# Patient Record
Sex: Female | Born: 1995 | Race: White | Hispanic: No | Marital: Married | Smoking: Never smoker
Health system: Southern US, Community
[De-identification: ages and names within clinical notes are randomized; demographics above are authoritative.]

## PROBLEM LIST (undated history)

## (undated) ENCOUNTER — Ambulatory Visit: Discharge status: Absent without leave | Payer: BC Managed Care – PPO

## (undated) ENCOUNTER — Inpatient Hospital Stay (HOSPITAL_COMMUNITY): Payer: Self-pay

## (undated) DIAGNOSIS — J302 Other seasonal allergic rhinitis: Secondary | ICD-10-CM

## (undated) DIAGNOSIS — D649 Anemia, unspecified: Secondary | ICD-10-CM

## (undated) DIAGNOSIS — R000 Tachycardia, unspecified: Secondary | ICD-10-CM

## (undated) DIAGNOSIS — Z803 Family history of malignant neoplasm of breast: Secondary | ICD-10-CM

## (undated) DIAGNOSIS — Z800 Family history of malignant neoplasm of digestive organs: Secondary | ICD-10-CM

## (undated) HISTORY — DX: Family history of malignant neoplasm of breast: Z80.3

## (undated) HISTORY — DX: Tachycardia, unspecified: R00.0

## (undated) HISTORY — DX: Other seasonal allergic rhinitis: J30.2

## (undated) HISTORY — PX: MULTIPLE TOOTH EXTRACTIONS: SHX2053

## (undated) HISTORY — DX: Family history of malignant neoplasm of digestive organs: Z80.0

## (undated) HISTORY — PX: NASAL FRACTURE SURGERY: SHX718

---
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
## 2016-05-21 DIAGNOSIS — R5383 Other fatigue: Secondary | ICD-10-CM | POA: Diagnosis not present

## 2016-05-23 DIAGNOSIS — R5383 Other fatigue: Secondary | ICD-10-CM | POA: Diagnosis not present

## 2016-07-13 DIAGNOSIS — L2489 Irritant contact dermatitis due to other agents: Secondary | ICD-10-CM | POA: Diagnosis not present

## 2016-09-25 ENCOUNTER — Other Ambulatory Visit: Payer: Self-pay | Admitting: Adult Health

## 2016-10-01 DIAGNOSIS — J00 Acute nasopharyngitis [common cold]: Secondary | ICD-10-CM | POA: Diagnosis not present

## 2016-10-01 DIAGNOSIS — J029 Acute pharyngitis, unspecified: Secondary | ICD-10-CM | POA: Diagnosis not present

## 2016-10-01 DIAGNOSIS — Z20828 Contact with and (suspected) exposure to other viral communicable diseases: Secondary | ICD-10-CM | POA: Diagnosis not present

## 2016-10-17 ENCOUNTER — Encounter: Payer: Self-pay | Admitting: Adult Health

## 2016-10-17 ENCOUNTER — Other Ambulatory Visit (HOSPITAL_COMMUNITY)
Admission: RE | Admit: 2016-10-17 | Discharge: 2016-10-17 | Disposition: A | Discharge status: Home / Self Care | Payer: 59 | Source: Ambulatory Visit | Attending: Adult Health | Admitting: Adult Health

## 2016-10-17 ENCOUNTER — Ambulatory Visit (INDEPENDENT_AMBULATORY_CARE_PROVIDER_SITE_OTHER): Payer: 59 | Admitting: Adult Health

## 2016-10-17 VITALS — BP 128/90 | HR 78 | Resp 18 | Ht 60.0 in | Wt 149.0 lb

## 2016-10-17 DIAGNOSIS — Z01419 Encounter for gynecological examination (general) (routine) without abnormal findings: Secondary | ICD-10-CM

## 2016-10-17 NOTE — Patient Instructions (Signed)
Physical in 1 year Pap in 3 if normal  Take folic acid

## 2016-10-17 NOTE — Addendum Note (Signed)
Addended by: Federico Flake A on: 10/17/2016 11:01 AM   Modules accepted: Orders

## 2016-10-17 NOTE — Progress Notes (Signed)
Patient ID: Sarah Blankenship, female   DOB: 17-Jul-1995, 21 y.o.   MRN: [id number] History of Present Illness: Sarah Blankenship is a 21 year old white female, G0P0 in for well woman gyn exam and first pap. PCP is [place].    Current Medications, Allergies, Past Medical History, Past Surgical History, Family History and Social History were reviewed in [employer] record.     Review of Systems: Patient denies any headaches, hearing loss, fatigue, blurred vision, shortness of breath, chest pain, abdominal pain, problems with bowel movements, urination, or intercourse. No joint pain or mood swings. Uses condoms.     Physical Exam:BP 128/90 (BP Location: Left Arm, Patient Position: Sitting, Cuff Size: Small)   Pulse 78   Resp 18   Ht 5' (1.524 m)   Wt 149 lb (67.6 kg)   LMP 09/20/2016   BMI 29.10 kg/m  General:  Well developed, well nourished, no acute distress Skin:  Warm and dry Neck:  Midline trachea, normal thyroid, good ROM, no lymphadenopathy Lungs; Clear to auscultation bilaterally Breast:  No dominant palpable mass, retraction, or nipple discharge Cardiovascular: Regular rate and rhythm Abdomen:  Soft, non tender, no hepatosplenomegaly Pelvic:  External genitalia is normal in appearance, no lesions.  The vagina is normal in appearance. Urethra has no lesions or masses. The cervix is smooth, pap with GC/CHL and reflex HPV performed.  Uterus is felt to be normal size, shape, and contour.  No adnexal masses or tenderness noted.Bladder is non tender, no masses felt. Extremities/musculoskeletal:  No swelling or varicosities noted, no clubbing or cyanosis Psych:  No mood changes, alert and cooperative,seems happy PHQ 2 score 0.  Impression: 1. Encounter for gynecological examination with Papanicolaou smear of cervix       Plan: Physical in 1 year Pap in 3 if normal  Take folic acid

## 2016-10-18 LAB — CYTOLOGY - PAP
Chlamydia: NEGATIVE
Diagnosis: NEGATIVE
Neisseria Gonorrhea: NEGATIVE

## 2017-05-28 DIAGNOSIS — J029 Acute pharyngitis, unspecified: Secondary | ICD-10-CM | POA: Diagnosis not present

## 2017-09-19 DIAGNOSIS — N390 Urinary tract infection, site not specified: Secondary | ICD-10-CM | POA: Diagnosis not present

## 2017-09-19 DIAGNOSIS — R300 Dysuria: Secondary | ICD-10-CM | POA: Diagnosis not present

## 2017-10-14 ENCOUNTER — Encounter: Payer: Self-pay | Admitting: Obstetrics and Gynecology

## 2017-10-14 ENCOUNTER — Ambulatory Visit: Payer: 59 | Admitting: Obstetrics and Gynecology

## 2017-10-14 ENCOUNTER — Other Ambulatory Visit (INDEPENDENT_AMBULATORY_CARE_PROVIDER_SITE_OTHER): Payer: 59

## 2017-10-14 ENCOUNTER — Other Ambulatory Visit: Payer: Self-pay | Admitting: Obstetrics and Gynecology

## 2017-10-14 VITALS — BP 124/80 | HR 88 | Ht 60.0 in | Wt 152.7 lb

## 2017-10-14 DIAGNOSIS — N8311 Corpus luteum cyst of right ovary: Secondary | ICD-10-CM

## 2017-10-14 DIAGNOSIS — Z3A01 Less than 8 weeks gestation of pregnancy: Secondary | ICD-10-CM

## 2017-10-14 DIAGNOSIS — E663 Overweight: Secondary | ICD-10-CM

## 2017-10-14 DIAGNOSIS — O219 Vomiting of pregnancy, unspecified: Secondary | ICD-10-CM | POA: Diagnosis not present

## 2017-10-14 DIAGNOSIS — N926 Irregular menstruation, unspecified: Secondary | ICD-10-CM

## 2017-10-14 DIAGNOSIS — O3411 Maternal care for benign tumor of corpus uteri, first trimester: Secondary | ICD-10-CM

## 2017-10-14 DIAGNOSIS — Z3201 Encounter for pregnancy test, result positive: Secondary | ICD-10-CM

## 2017-10-14 LAB — POCT URINE PREGNANCY: Preg Test, Ur: POSITIVE — AB

## 2017-10-14 NOTE — Progress Notes (Signed)
  Subjective:     Patient ID: Sarah Blankenship, female   DOB: 04-14-1995, 22 y.o.   MRN: [id number]  HPI Here for pregnancy confirmation reporting LMP 08/28/17, giving EDC 06/04/18 and EGA [redacted]w[redacted]d. Daily nausea and vomiting for the last week.   Review of Systems  Constitutional: Positive for fatigue.  Gastrointestinal: Positive for nausea and vomiting.  All other systems reviewed and are negative.      Objective:   Physical Exam A&Ox4 Well groomed female in no distress Blood pressure 124/80, pulse 88, height 5' (1.524 m), weight 152 lb 11.2 oz (69.3 kg), last menstrual period 08/28/2017. Body mass index is 29.82 kg/m.  UPT+ viability ultrasound reveals: [place] intrauterine pregnancy is visualized with a CRL consistent with 6 5/[redacted] weeks gestation, giving an (U/S) EDD of 06/04/18. The (U/S) EDD is consistent with the clinically established (LMP) EDD of 06/04/18.  FHR: 128 BPM CRL measurement: 8.0 mm Yolk sac and early anatomy is normal.  Right Ovary measures 3.5 x 2.4 x 2.7 cm. It is normal in appearance. Left Ovary measures 2.5 x 2.0 x 1.5 cm. It is normal appearance. There is evidence of a corpus luteal cyst in the right ovary. Survey of the adnexa demonstrates no adnexal masses. There is no free peritoneal fluid in the cul de sac.    Assessment:     Missed menses Overweight Nausea and vomiting     Plan:     New OB labs and nurse intake in 2 weeks then New OB PE with me in 5weeks.   Bonjesta samples given and instructed on use.    Melody Shambley,CNM

## 2017-10-14 NOTE — Patient Instructions (Signed)
First Trimester of Pregnancy The first trimester of pregnancy is from week 1 until the end of week 13 (months 1 through 3). During this time, your baby will begin to develop inside you. At 6-8 weeks, the eyes and face are formed, and the heartbeat can be seen on ultrasound. At the end of 12 weeks, all the baby's organs are formed. Prenatal care is all the medical care you receive before the birth of your baby. Make sure you get good prenatal care and follow all of your doctor's instructions. Follow these instructions at home: Medicines  Take over-the-counter and prescription medicines only as told by your doctor. Some medicines are safe and some medicines are not safe during pregnancy.  Take a prenatal vitamin that contains at least 600 micrograms (mcg) of folic acid.  If you have trouble pooping (constipation), take medicine that will make your stool soft (stool softener) if your doctor approves. Eating and drinking  Eat regular, healthy meals.  Your doctor will tell you the amount of weight gain that is right for you.  Avoid raw meat and uncooked cheese.  If you feel sick to your stomach (nauseous) or throw up (vomit): ? Eat 4 or 5 small meals a day instead of 3 large meals. ? Try eating a few soda crackers. ? Drink liquids between meals instead of during meals.  To prevent constipation: ? Eat foods that are high in fiber, like fresh fruits and vegetables, whole grains, and beans. ? Drink enough fluids to keep your pee (urine) clear or pale yellow. Activity  Exercise only as told by your doctor. Stop exercising if you have cramps or pain in your lower belly (abdomen) or low back.  Do not exercise if it is too hot, too humid, or if you are in a place of great height (high altitude).  Try to avoid standing for long periods of time. Move your legs often if you must stand in one place for a long time.  Avoid heavy lifting.  Wear low-heeled shoes. Sit and stand up straight.  You  can have sex unless your doctor tells you not to. Relieving pain and discomfort  Wear a good support bra if your breasts are sore.  Take warm water baths (sitz baths) to soothe pain or discomfort caused by hemorrhoids. Use hemorrhoid cream if your doctor says it is okay.  Rest with your legs raised if you have leg cramps or low back pain.  If you have puffy, bulging veins (varicose veins) in your legs: ? Wear support hose or compression stockings as told by your doctor. ? Raise (elevate) your feet for 15 minutes, 3-4 times a day. ? Limit salt in your food. Prenatal care  Schedule your prenatal visits by the twelfth week of pregnancy.  Write down your questions. Take them to your prenatal visits.  Keep all your prenatal visits as told by your doctor. This is important. Safety  Wear your seat belt at all times when driving.  Make a list of emergency phone numbers. The list should include numbers for family, friends, the hospital, and police and fire departments. General instructions  Ask your doctor for a referral to a local prenatal class. Begin classes no later than at the start of month 6 of your pregnancy.  Ask for help if you need counseling or if you need help with nutrition. Your doctor can give you advice or tell you where to go for help.  Do not use hot tubs, steam rooms, or   saunas.  Do not douche or use tampons or scented sanitary pads.  Do not cross your legs for long periods of time.  Avoid all herbs and alcohol. Avoid drugs that are not approved by your doctor.  Do not use any tobacco products, including cigarettes, chewing tobacco, and electronic cigarettes. If you need help quitting, ask your doctor. You may get counseling or other support to help you quit.  Avoid cat litter boxes and soil used by cats. These carry germs that can cause birth defects in the baby and can cause a loss of your baby (miscarriage) or stillbirth.  Visit your dentist. At home, brush  your teeth with a soft toothbrush. Be gentle when you floss. Contact a doctor if:  You are dizzy.  You have mild cramps or pressure in your lower belly.  You have a nagging pain in your belly area.  You continue to feel sick to your stomach, you throw up, or you have watery poop (diarrhea).  You have a bad smelling fluid coming from your vagina.  You have pain when you pee (urinate).  You have increased puffiness (swelling) in your face, hands, legs, or ankles. Get help right away if:  You have a fever.  You are leaking fluid from your vagina.  You have spotting or bleeding from your vagina.  You have very bad belly cramping or pain.  You gain or lose weight rapidly.  You throw up blood. It may look like coffee grounds.  You are around people who have [place] measles, fifth disease, or chickenpox.  You have a very bad headache.  You have shortness of breath.  You have any kind of trauma, such as from a fall or a car accident. Summary  The first trimester of pregnancy is from week 1 until the end of week 13 (months 1 through 3).  To take care of yourself and your unborn baby, you will need to eat healthy meals, take medicines only if your doctor tells you to do so, and do activities that are safe for you and your baby.  Keep all follow-up visits as told by your doctor. This is important as your doctor will have to ensure that your baby is healthy and growing well. This information is not intended to replace advice given to you by your health care provider. Make sure you discuss any questions you have with your health care provider. Document Released: 06/12/2007 Document Revised: 01/02/2016 Document Reviewed: 01/02/2016     Morning Sickness Morning sickness is when you feel sick to your stomach (nauseous) during pregnancy. This nauseous feeling may or may not come with vomiting. It often occurs in the morning but can be a problem any time of day. Morning sickness is  most common during the first trimester, but it may continue throughout pregnancy. While morning sickness is unpleasant, it is usually harmless unless you develop severe and continual vomiting (hyperemesis gravidarum). This condition requires more intense treatment. What are the causes? The cause of morning sickness is not completely known but seems to be related to normal hormonal changes that occur in pregnancy. What increases the risk? You are at greater risk if you:  Experienced nausea or vomiting before your pregnancy.  Had morning sickness during a previous pregnancy.  Are pregnant with more than one baby, such as twins.  How is this treated? Do not use any medicines (prescription, over-the-counter, or herbal) for morning sickness without first talking to your health care provider. Your health care provider  may prescribe or recommend:  Vitamin B6 supplements.  Anti-nausea medicines.  The herbal medicine ginger.  Follow these instructions at home:  Only take over-the-counter or prescription medicines as directed by your health care provider.  Taking multivitamins before getting pregnant can prevent or decrease the severity of morning sickness in most women.  Eat a piece of dry toast or unsalted crackers before getting out of bed in the morning.  Eat five or six small meals a day.  Eat dry and bland foods (rice, baked potato). Foods high in carbohydrates are often helpful.  Do not drink liquids with your meals. Drink liquids between meals.  Avoid greasy, fatty, and spicy foods.  Get someone to cook for you if the smell of any food causes nausea and vomiting.  If you feel nauseous after taking prenatal vitamins, take the vitamins at night or with a snack.  Snack on protein foods (nuts, yogurt, cheese) between meals if you are hungry.  Eat unsweetened gelatins for desserts.  Wearing an acupressure wristband (worn for sea sickness) may be helpful.  Acupuncture may be  helpful.  Do not smoke.  Get a humidifier to keep the air in your house free of odors.  Get plenty of fresh air. Contact a health care provider if:  Your home remedies are not working, and you need medicine.  You feel dizzy or lightheaded.  You are losing weight. Get help right away if:  You have persistent and uncontrolled nausea and vomiting.  You pass out (faint). This information is not intended to replace advice given to you by your health care provider. Make sure you discuss any questions you have with your health care provider. Document Released: 02/14/2006 Document Revised: 06/01/2015 Document Reviewed: 06/10/2012 Elsevier Interactive Patient Education  2017 [employer].  Risk analyst Patient Education  [employer].

## 2017-10-24 ENCOUNTER — Ambulatory Visit (INDEPENDENT_AMBULATORY_CARE_PROVIDER_SITE_OTHER): Payer: 59 | Admitting: Obstetrics and Gynecology

## 2017-10-24 VITALS — BP 115/75 | HR 86 | Ht 60.0 in | Wt 153.8 lb

## 2017-10-24 DIAGNOSIS — Z202 Contact with and (suspected) exposure to infections with a predominantly sexual mode of transmission: Secondary | ICD-10-CM

## 2017-10-24 DIAGNOSIS — O219 Vomiting of pregnancy, unspecified: Secondary | ICD-10-CM

## 2017-10-24 DIAGNOSIS — R638 Other symptoms and signs concerning food and fluid intake: Secondary | ICD-10-CM | POA: Diagnosis not present

## 2017-10-24 DIAGNOSIS — Z3401 Encounter for supervision of normal first pregnancy, first trimester: Secondary | ICD-10-CM | POA: Diagnosis not present

## 2017-10-24 NOTE — Patient Instructions (Signed)
WHAT OB PATIENTS CAN EXPECT   Confirmation of pregnancy and ultrasound ordered if medically indicated-[redacted] weeks gestation  New OB (NOB) intake with nurse and New OB (NOB) labs- [redacted] weeks gestation  New OB (NOB) physical examination with provider- 11/[redacted] weeks gestation  Flu vaccine-[redacted] weeks gestation  Anatomy scan-[redacted] weeks gestation  Glucose tolerance test, blood work to test for anemia, T-dap vaccine-[redacted] weeks gestation  Vaginal swabs/cultures-STD/Group B strep-[redacted] weeks gestation  Appointments every 4 weeks until 28 weeks  Every 2 weeks from 28 weeks until 36 weeks  Weekly visits from 36 weeks until delivery  Waterbirth Class  June 19, 2016  Wednesday 7:00p - 9:00p  [place], [place]  July 24, 2016  Wednesday [place], [place]    August 28, 2016   Wednesday [place], [place]  September 25, 2016  Wednesday  [place], [place]  October 23, 2016 Wednesday [place], [place]  Interested in a waterbirth?  This informational class will help you discover whether waterbirth is the right fit for you.  Education about waterbirth itself, supplies you would need and how to assemble your support team is what you can expect from this class.  Some obstetrical practices require this class in order to pursue a waterbirth.  (Not all obstetrical practices offer waterbirth check with your healthcare provider)  Register only the expectant mom, but you are encouraged to bring your partner to class!  Fees & Payment No fee  Register Online [email or web address] Search Waterbirth Morning Sickness Morning sickness is when you feel sick to your stomach (nauseous) during pregnancy. You may feel sick to your stomach and throw up (vomit). You may feel sick in the morning, but you can  feel this way any time of day. Some women feel very sick to their stomach and cannot stop throwing up (hyperemesis gravidarum). Follow these instructions at home:  Only take medicines as told by your doctor.  Take multivitamins as told by your doctor. Taking multivitamins before getting pregnant can stop or lessen the harshness of morning sickness.  Eat dry toast or unsalted crackers before getting out of bed.  Eat 5 to 6 small meals a day.  Eat dry and bland foods like rice and baked potatoes.  Do not drink liquids with meals. Drink between meals.  Do not eat greasy, fatty, or spicy foods.  Have someone cook for you if the smell of food causes you to feel sick or throw up.  If you feel sick to your stomach after taking prenatal vitamins, take them at night or with a snack.  Eat protein when you need a snack (nuts, yogurt, cheese).  Eat unsweetened gelatins for dessert.  Wear a bracelet used for sea sickness (acupressure wristband).  Go to a doctor that puts thin needles into certain body points (acupuncture) to improve how you feel.  Do not smoke.  Use a humidifier to keep the air in your house free of odors.  Get lots of fresh air. Contact a doctor if:  You need medicine to feel better.  You feel dizzy or lightheaded.  You are losing weight. Get help right away if:  You feel very sick to your stomach and cannot stop throwing up.  You pass out (faint). This information is not intended to replace advice given to you by your health care provider. Make sure you  discuss any questions you have with your health care provider. Document Released: 02/01/2004 Document Revised: 06/01/2015 Document Reviewed: 06/10/2012 Elsevier Interactive Patient Education  2017 [employer]. How a Baby Grows During Pregnancy Pregnancy begins when a female's sperm enters a female's egg (fertilization). This happens in one of the tubes (fallopian tubes) that connect the ovaries to the womb  (uterus). The fertilized egg is called an embryo until it reaches 10 weeks. From 10 weeks until birth, it is called a fetus. The fertilized egg moves down the fallopian tube to the uterus. Then it implants into the lining of the uterus and begins to grow. The developing fetus receives oxygen and nutrients through the pregnant woman's bloodstream and the tissues that grow (placenta) to support the fetus. The placenta is the life support system for the fetus. It provides nutrition and removes waste. Learning as much as you can about your pregnancy and how your baby is developing can help you enjoy the experience. It can also make you aware of when there might be a problem and when to ask questions. How long does a typical pregnancy last? A pregnancy usually lasts 280 days, or about 40 weeks. Pregnancy is divided into three trimesters:  First trimester: 0-13 weeks.  Second trimester: 14-27 weeks.  Third trimester: 28-40 weeks.  The day when your baby is considered ready to be born (full term) is your estimated date of delivery. How does my baby develop month by month? First month  The fertilized egg attaches to the inside of the uterus.  Some cells will form the placenta. Others will form the fetus.  The arms, legs, brain, spinal cord, lungs, and heart begin to develop.  At the end of the first month, the heart begins to beat.  Second month  The bones, inner ear, eyelids, hands, and feet form.  The genitals develop.  By the end of 8 weeks, all major organs are developing.  Third month  All of the internal organs are forming.  Teeth develop below the gums.  Bones and muscles begin to grow. The spine can flex.  The skin is transparent.  Fingernails and toenails begin to form.  Arms and legs continue to grow longer, and hands and feet develop.  The fetus is about 3 in (7.6 cm) long.  Fourth month  The placenta is completely formed.  The external sex organs, neck, outer  ear, eyebrows, eyelids, and fingernails are formed.  The fetus can hear, swallow, and move its arms and legs.  The kidneys begin to produce urine.  The skin is covered with a white waxy coating (vernix) and very fine hair (lanugo).  Fifth month  The fetus moves around more and can be felt for the first time (quickening).  The fetus starts to sleep and wake up and may begin to suck its finger.  The nails grow to the end of the fingers.  The organ in the digestive system that makes bile (gallbladder) functions and helps to digest the nutrients.  If your baby is a girl, eggs are present in her ovaries. If your baby is a boy, testicles start to move down into his scrotum.  Sixth month  The lungs are formed, but the fetus is not yet able to breathe.  The eyes open. The brain continues to develop.  Your baby has fingerprints and toe prints. Your baby's hair grows thicker.  At the end of the second trimester, the fetus is about 9 in (22.9 cm) long.  Seventh  month  The fetus kicks and stretches.  The eyes are developed enough to sense changes in light.  The hands can make a grasping motion.  The fetus responds to sound.  Eighth month  All organs and body systems are fully developed and functioning.  Bones harden and taste buds develop. The fetus may hiccup.  Certain areas of the brain are still developing. The skull remains soft.  Ninth month  The fetus gains about  lb (0.23 kg) each week.  The lungs are fully developed.  Patterns of sleep develop.  The fetus's head typically moves into a head-down position (vertex) in the uterus to prepare for birth. If the buttocks move into a vertex position instead, the baby is breech.  The fetus weighs 6-9 lbs (2.72-4.08 kg) and is 19-20 in (48.26-50.8 cm) long.  What can I do to have a healthy pregnancy and help my baby develop? Eating and Drinking  Eat a healthy diet. ? Talk with your health care provider to make sure  that you are getting the nutrients that you and your baby need. ? Visit www.BuildDNA.es to learn about creating a healthy diet.  Gain a healthy amount of weight during pregnancy as advised by your health care provider. This is usually 25-35 pounds. You may need to: ? Gain more if you were underweight before getting pregnant or if you are pregnant with more than one baby. ? Gain less if you were overweight or obese when you got pregnant.  Medicines and Vitamins  Take prenatal vitamins as directed by your health care provider. These include vitamins such as folic acid, iron, calcium, and vitamin D. They are important for healthy development.  Take medicines only as directed by your health care provider. Read labels and ask a pharmacist or your health care provider whether over-the-counter medicines, supplements, and prescription drugs are safe to take during pregnancy.  Activities  Be physically active as advised by your health care provider. Ask your health care provider to recommend activities that are safe for you to do, such as walking or swimming.  Do not participate in strenuous or extreme sports.  Lifestyle  Do not drink alcohol.  Do not use any tobacco products, including cigarettes, chewing tobacco, or electronic cigarettes. If you need help quitting, ask your health care provider.  Do not use illegal drugs.  Safety  Avoid exposure to mercury, lead, or other heavy metals. Ask your health care provider about common sources of these heavy metals.  Avoid listeria infection during pregnancy. Follow these precautions: ? Do not eat soft cheeses or deli meats. ? Do not eat hot dogs unless they have been warmed up to the point of steaming, such as in the microwave oven. ? Do not drink unpasteurized milk.  Avoid toxoplasmosis infection during pregnancy. Follow these precautions: ? Do not change your cat's litter box, if you have a cat. Ask someone else to do this for  you. ? Wear gardening gloves while working in the yard.  General Instructions  Keep all follow-up visits as directed by your health care provider. This is important. This includes prenatal care and screening tests.  Manage any chronic health conditions. Work closely with your health care provider to keep conditions, such as diabetes, under control.  How do I know if my baby is developing well? At each prenatal visit, your health care provider will do several different tests to check on your health and keep track of your baby's development. These include:  Fundal height. ?  Your health care provider will measure your growing belly from top to bottom using a tape measure. ? Your health care provider will also feel your belly to determine your baby's position.  Heartbeat. ? An ultrasound in the first trimester can confirm pregnancy and show a heartbeat, depending on how far along you are. ? Your health care provider will check your baby's heart rate at every prenatal visit. ? As you get closer to your delivery date, you may have regular fetal heart rate monitoring to make sure that your baby is not in distress.  Second trimester ultrasound. ? This ultrasound checks your baby's development. It also indicates your baby's gender.  What should I do if I have concerns about my baby's development? Always talk with your health care provider about any concerns that you may have. This information is not intended to replace advice given to you by your health care provider. Make sure you discuss any questions you have with your health care provider. Document Released: 06/12/2007 Document Revised: 06/01/2015 Document Reviewed: 06/02/2013 Elsevier Interactive Patient Education  2018 [place] of Pregnancy The first trimester of pregnancy is from week 1 until the end of week 13 (months 1 through 3). A week after a sperm fertilizes an egg, the egg will implant on the wall of the  uterus. This embryo will begin to develop into a baby. Genes from you and your partner will form the baby. The female genes will determine whether the baby will be a boy or a girl. At 6-8 weeks, the eyes and face will be formed, and the heartbeat can be seen on ultrasound. At the end of 12 weeks, all the baby's organs will be formed. Now that you are pregnant, you will want to do everything you can to have a healthy baby. Two of the most important things are to get good prenatal care and to follow your health care provider's instructions. Prenatal care is all the medical care you receive before the baby's birth. This care will help prevent, find, and treat any problems during the pregnancy and childbirth. Body changes during your first trimester Your body goes through many changes during pregnancy. The changes vary from woman to woman.  You may gain or lose a couple of pounds at first.  You may feel sick to your stomach (nauseous) and you may throw up (vomit). If the vomiting is uncontrollable, call your health care provider.  You may tire easily.  You may develop headaches that can be relieved by medicines. All medicines should be approved by your health care provider.  You may urinate more often. Painful urination may mean you have a bladder infection.  You may develop heartburn as a result of your pregnancy.  You may develop constipation because certain hormones are causing the muscles that push stool through your intestines to slow down.  You may develop hemorrhoids or swollen veins (varicose veins).  Your breasts may begin to grow larger and become tender. Your nipples may stick out more, and the tissue that surrounds them (areola) may become darker.  Your gums may bleed and may be sensitive to brushing and flossing.  Dark spots or blotches (chloasma, mask of pregnancy) may develop on your face. This will likely fade after the baby is born.  Your menstrual periods will stop.  You may  have a loss of appetite.  You may develop cravings for certain kinds of food.  You may have changes in your emotions from day to  day, such as being excited to be pregnant or being concerned that something may go wrong with the pregnancy and baby.  You may have more vivid and strange dreams.  You may have changes in your hair. These can include thickening of your hair, rapid growth, and changes in texture. Some women also have hair loss during or after pregnancy, or hair that feels dry or thin. Your hair will most likely return to normal after your baby is born.  What to expect at prenatal visits During a routine prenatal visit:  You will be weighed to make sure you and the baby are growing normally.  Your blood pressure will be taken.  Your abdomen will be measured to track your baby's growth.  The fetal heartbeat will be listened to between weeks 10 and 14 of your pregnancy.  Test results from any previous visits will be discussed.  Your health care provider may ask you:  How you are feeling.  If you are feeling the baby move.  If you have had any abnormal symptoms, such as leaking fluid, bleeding, severe headaches, or abdominal cramping.  If you are using any tobacco products, including cigarettes, chewing tobacco, and electronic cigarettes.  If you have any questions.  Other tests that may be performed during your first trimester include:  Blood tests to find your blood type and to check for the presence of any previous infections. The tests will also be used to check for low iron levels (anemia) and protein on red blood cells (Rh antibodies). Depending on your risk factors, or if you previously had diabetes during pregnancy, you may have tests to check for high blood sugar that affects pregnant women (gestational diabetes).  Urine tests to check for infections, diabetes, or protein in the urine.  An ultrasound to confirm the proper growth and development of the  baby.  Fetal screens for spinal cord problems (spina bifida) and Down syndrome.  HIV (human immunodeficiency virus) testing. Routine prenatal testing includes screening for HIV, unless you choose not to have this test.  You may need other tests to make sure you and the baby are doing well.  Follow these instructions at home: Medicines  Follow your health care provider's instructions regarding medicine use. Specific medicines may be either safe or unsafe to take during pregnancy.  Take a prenatal vitamin that contains at least 600 micrograms (mcg) of folic acid.  If you develop constipation, try taking a stool softener if your health care provider approves. Eating and drinking  Eat a balanced diet that includes fresh fruits and vegetables, whole grains, good sources of protein such as meat, eggs, or tofu, and low-fat dairy. Your health care provider will help you determine the amount of weight gain that is right for you.  Avoid raw meat and uncooked cheese. These carry germs that can cause birth defects in the baby.  Eating four or five small meals rather than three large meals a day may help relieve nausea and vomiting. If you start to feel nauseous, eating a few soda crackers can be helpful. Drinking liquids between meals, instead of during meals, also seems to help ease nausea and vomiting.  Limit foods that are high in fat and processed sugars, such as fried and sweet foods.  To prevent constipation: ? Eat foods that are high in fiber, such as fresh fruits and vegetables, whole grains, and beans. ? Drink enough fluid to keep your urine clear or pale yellow. Activity  Exercise only as directed  by your health care provider. Most women can continue their usual exercise routine during pregnancy. Try to exercise for 30 minutes at least 5 days a week. Exercising will help you: ? Control your weight. ? Stay in shape. ? Be prepared for labor and delivery.  Experiencing pain or cramping  in the lower abdomen or lower back is a good sign that you should stop exercising. Check with your health care provider before continuing with normal exercises.  Try to avoid standing for long periods of time. Move your legs often if you must stand in one place for a long time.  Avoid heavy lifting.  Wear low-heeled shoes and practice good posture.  You may continue to have sex unless your health care provider tells you not to. Relieving pain and discomfort  Wear a good support bra to relieve breast tenderness.  Take warm sitz baths to soothe any pain or discomfort caused by hemorrhoids. Use hemorrhoid cream if your health care provider approves.  Rest with your legs elevated if you have leg cramps or low back pain.  If you develop varicose veins in your legs, wear support hose. Elevate your feet for 15 minutes, 3-4 times a day. Limit salt in your diet. Prenatal care  Schedule your prenatal visits by the twelfth week of pregnancy. They are usually scheduled monthly at first, then more often in the last 2 months before delivery.  Write down your questions. Take them to your prenatal visits.  Keep all your prenatal visits as told by your health care provider. This is important. Safety  Wear your seat belt at all times when driving.  Make a list of emergency phone numbers, including numbers for family, friends, the hospital, and police and fire departments. General instructions  Ask your health care provider for a referral to a local prenatal education class. Begin classes no later than the beginning of month 6 of your pregnancy.  Ask for help if you have counseling or nutritional needs during pregnancy. Your health care provider can offer advice or refer you to specialists for help with various needs.  Do not use hot tubs, steam rooms, or saunas.  Do not douche or use tampons or scented sanitary pads.  Do not cross your legs for long periods of time.  Avoid cat litter boxes  and soil used by cats. These carry germs that can cause birth defects in the baby and possibly loss of the fetus by miscarriage or stillbirth.  Avoid all smoking, herbs, alcohol, and medicines not prescribed by your health care provider. Chemicals in these products affect the formation and growth of the baby.  Do not use any products that contain nicotine or tobacco, such as cigarettes and e-cigarettes. If you need help quitting, ask your health care provider. You may receive counseling support and other resources to help you quit.  Schedule a dentist appointment. At home, brush your teeth with a soft toothbrush and be gentle when you floss. Contact a health care provider if:  You have dizziness.  You have mild pelvic cramps, pelvic pressure, or nagging pain in the abdominal area.  You have persistent nausea, vomiting, or diarrhea.  You have a bad smelling vaginal discharge.  You have pain when you urinate.  You notice increased swelling in your face, hands, legs, or ankles.  You are exposed to fifth disease or chickenpox.  You are exposed to [place] measles (rubella) and have never had it. Get help right away if:  You have a fever.  You are leaking fluid from your vagina.  You have spotting or bleeding from your vagina.  You have severe abdominal cramping or pain.  You have rapid weight gain or loss.  You vomit blood or material that looks like coffee grounds.  You develop a severe headache.  You have shortness of breath.  You have any kind of trauma, such as from a fall or a car accident. Summary  The first trimester of pregnancy is from week 1 until the end of week 13 (months 1 through 3).  Your body goes through many changes during pregnancy. The changes vary from woman to woman.  You will have routine prenatal visits. During those visits, your health care provider will examine you, discuss any test results you may have, and talk with you about how you are  feeling. This information is not intended to replace advice given to you by your health care provider. Make sure you discuss any questions you have with your health care provider. Document Released: 12/18/2000 Document Revised: 12/06/2015 Document Reviewed: 12/06/2015 Elsevier Interactive Patient Education  2018 [employer]. Commonly Asked Questions During Pregnancy  Cats: A parasite can be excreted in cat feces.  To avoid exposure you need to have another person empty the little box.  If you must empty the litter box you will need to wear gloves.  Wash your hands after handling your cat.  This parasite can also be found in raw or undercooked meat so this should also be avoided.  Colds, Sore Throats, Flu: Please check your medication sheet to see what you can take for symptoms.  If your symptoms are unrelieved by these medications please call the office.  Dental Work: Most any dental work Investment banker[employer] recommends is permitted.  X-rays should only be taken during the first trimester if absolutely necessary.  Your abdomen should be shielded with a lead apron during all x-rays.  Please notify your provider prior to receiving any x-rays.  Novocaine is fine; gas is not recommended.  If your dentist requires a note from [place] prior to dental work please call the office and we will provide one for you.  Exercise: Exercise is an important part of staying healthy during your pregnancy.  You may continue most exercises you were accustomed to prior to pregnancy.  Later in your pregnancy you will most likely notice you have difficulty with activities requiring balance like riding a bicycle.  It is important that you listen to your body and avoid activities that put you at a higher risk of falling.  Adequate rest and staying well hydrated are a must!  If you have questions about the safety of specific activities ask your provider.    Exposure to Children with illness: Try to avoid obvious exposure; report any  symptoms to [place] when noted,  If you have chicken pos, red measles or mumps, you should be immune to these diseases.   Please do not take any vaccines while pregnant unless you have checked with your OB provider.  Fetal Movement: After 28 weeks we recommend you do "kick counts" twice daily.  Lie or sit down in a calm quiet environment and count your baby movements "kicks".  You should feel your baby at least 10 times per hour.  If you have not felt 10 kicks within the first hour get up, walk around and have something sweet to eat or drink then repeat for an additional hour.  If count remains less than 10 per hour notify your  provider.  Fumigating: Follow your pest control agent's advice as to how long to stay out of your home.  Ventilate the area well before re-entering.  Hemorrhoids:   Most over-the-counter preparations can be used during pregnancy.  Check your medication to see what is safe to use.  It is important to use a stool softener or fiber in your diet and to drink lots of liquids.  If hemorrhoids seem to be getting worse please call the office.   Hot Tubs:  Hot tubs Jacuzzis and saunas are not recommended while pregnant.  These increase your internal body temperature and should be avoided.  Intercourse:  Sexual intercourse is safe during pregnancy as long as you are comfortable, unless otherwise advised by your provider.  Spotting may occur after intercourse; report any bright red bleeding that is heavier than spotting.  Labor:  If you know that you are in labor, please go to the hospital.  If you are unsure, please call the office and let us help you decide what to do.  Lifting, straining, etc:  If your job requires heavy lifting or straining please check with your provider for any limitations.  Generally, you should not lift items heavier than that you can lift simply with your hands and arms (no back muscles)  Painting:  Paint fumes do not harm your pregnancy, but may make you ill and  should be avoided if possible.  Latex or water based paints have less odor than oils.  Use adequate ventilation while painting.  Permanents & Hair Color:  Chemicals in hair dyes are not recommended as they cause increase hair dryness which can increase hair loss during pregnancy.  " Highlighting" and permanents are allowed.  Dye may be absorbed differently and permanents may not hold as well during pregnancy.  Sunbathing:  Use a sunscreen, as skin burns easily during pregnancy.  Drink plenty of fluids; avoid over heating.  Tanning Beds:  Because their possible side effects are still unknown, tanning beds are not recommended.  Ultrasound Scans:  Routine ultrasounds are performed at approximately 20 weeks.  You will be able to see your baby's general anatomy an if you would like to know the gender this can usually be determined as well.  If it is questionable when you conceived you may also receive an ultrasound early in your pregnancy for dating purposes.  Otherwise ultrasound exams are not routinely performed unless there is a medical necessity.  Although you can request a scan we ask that you pay for it when conducted because insurance does not cover " patient request" scans.  Work: If your pregnancy proceeds without complications you may work until your due date, unless your physician or employer advises otherwise.  Round Ligament Pain/Pelvic Discomfort:  Sharp, shooting pains not associated with bleeding are fairly common, usually occurring in the second trimester of pregnancy.  They tend to be worse when standing up or when you remain standing for long periods of time.  These are the result of pressure of certain pelvic ligaments called "round ligaments".  Rest, Tylenol and heat seem to be the most effective relief.  As the womb and fetus grow, they rise out of the pelvis and the discomfort improves.  Please notify the office if your pain seems different than that described.  It may represent a more  serious condition.  Common Medications Safe in Pregnancy  Acne:      Constipation:  Benzoyl Peroxide     Colace  Clindamycin  Dulcolax Suppository  Topica Erythromycin     Fibercon  Salicylic Acid      Metamucil         Miralax AVOID:        Senakot   Accutane    Cough:  Retin-A       Cough Drops  Tetracycline      Phenergan w/ Codeine if Rx  Minocycline      Robitussin (Plain & DM)  Antibiotics:     Crabs/Lice:  Ceclor       RID  Cephalosporins    AVOID:  E-Mycins      Kwell  Keflex  Macrobid/Macrodantin   Diarrhea:  Penicillin      Kao-Pectate  Zithromax      Imodium AD         PUSH FLUIDS AVOID:       Cipro     Fever:  Tetracycline      Tylenol (Regular or Extra  Minocycline       Strength)  Levaquin      Extra Strength-Do not          Exceed 8 tabs/24 hrs Caffeine:        <261m/day (equiv. To 1 cup of coffee or  approx. 3 12 oz sodas)         Gas: Cold/Hayfever:       Gas-X  Benadryl      Mylicon  Claritin       Phazyme  **Claritin-D        Chlor-Trimeton    Headaches:  Dimetapp      ASA-Free Excedrin  Drixoral-Non-Drowsy     Cold Compress  Mucinex (Guaifenasin)     Tylenol (Regular or Extra  Sudafed/Sudafed-12 Hour     Strength)  **Sudafed PE Pseudoephedrine   Tylenol Cold & Sinus     Vicks Vapor Rub  Zyrtec  **AVOID if Problems With Blood Pressure         Heartburn: Avoid lying down for at least 1 hour after meals  Aciphex      Maalox     Rash:  Milk of Magnesia     Benadryl    Mylanta       1% Hydrocortisone Cream  Pepcid  Pepcid Complete   Sleep Aids:  Prevacid      Ambien   Prilosec       Benadryl  Rolaids       Chamomile Tea  Tums (Limit 4/day)     Unisom  Zantac       Tylenol PM         Warm milk-add vanilla or  Hemorrhoids:       Sugar for taste  Anusol/Anusol H.C.  (RX: Analapram 2.5%)  Sugar Substitutes:  Hydrocortisone OTC     Ok in moderation  Preparation H      Tucks        Vaseline lotion applied to tissue with  wiping    Herpes:     Throat:  Acyclovir      Oragel  Famvir  Valtrex     Vaccines:         Flu Shot Leg Cramps:       *Gardasil  Benadryl      Hepatitis A         Hepatitis B Nasal Spray:       Pneumovax  Saline Nasal Spray     Polio Booster         Tetanus Nausea:  Tuberculosis test or PPD  Vitamin B6 25 mg TID   AVOID:    Dramamine      *Gardasil  Emetrol       Live Poliovirus  Ginger Root 250 mg QID    MMR (measles, mumps &  High Complex Carbs @ Bedtime    rebella)  Sea Bands-Accupressure    Varicella (Chickenpox)  Unisom 1/2 tab TID     *No known complications           If received before Pain:         Known pregnancy;   Darvocet       Resume series after  Lortab        Delivery  Percocet    Yeast:   Tramadol      Femstat  Tylenol 3      Gyne-lotrimin  Ultram       Monistat  Vicodin           MISC:         All Sunscreens           Hair Coloring/highlights          Insect Repellant's          (Including DEET)         [facility]  [place], [place], [place] [address]  Phone: [phone]   [facility] (second location)  [address] [place], [place] [address]  Phone: [phone]   [facility] [place]) [place], [place], [place] [address] Phone: [phone]   [place] [place], [place] [address]  Phone: [phone]

## 2017-10-24 NOTE — Progress Notes (Signed)
Sarah Blankenship presents for NOB nurse interview visit. Pregnancy confirmation done __10/08/2017 with [facility]_.  G-1 .  P-0.  LMP- 08/28/2017 EDD-06/04/2018.  Pregnancy education material explained and given. __0_ cats in the home. NOB labs ordered. TSH/HbgA1c ordered  due to Increased BMI. Body mass index is 30.04 kg/m.  HIV labs and Drug screen were explained ordered. PNV encouraged. Genetic screening options discussed. Genetic testing: Unsure.  Pt to discuss with provider at [facility]. Pt given bonjesta at [facility]. She states her n/v is mild. She has not used the medication. N/V protocol given. Flu vaccine utd- 09/07/2017. Needs tdap at 28 weeks. [facility]. Financial policy approved. FMLA form reviewed and signed. To follow up with provider in _4_ weeks for NOB physical.  All questions answered.

## 2017-10-25 LAB — URINALYSIS, ROUTINE W REFLEX MICROSCOPIC
Bilirubin, UA: NEGATIVE
Glucose, UA: NEGATIVE
Ketones, UA: NEGATIVE
Leukocytes, UA: NEGATIVE
Nitrite, UA: NEGATIVE
Protein, UA: NEGATIVE
RBC, UA: NEGATIVE
Specific Gravity, UA: 1.024 (ref 1.005–1.030)
Urobilinogen, Ur: 1 mg/dL (ref 0.2–1.0)
pH, UA: 7.5 (ref 5.0–7.5)

## 2017-10-25 LAB — HIV ANTIBODY (ROUTINE TESTING W REFLEX): HIV Screen 4th Generation wRfx: NONREACTIVE

## 2017-10-25 LAB — HEPATITIS B SURFACE ANTIGEN: Hepatitis B Surface Ag: NEGATIVE

## 2017-10-25 LAB — RPR: RPR Ser Ql: NONREACTIVE

## 2017-10-25 LAB — HEMOGLOBIN A1C
Est. average glucose Bld gHb Est-mCnc: 91 mg/dL
Hgb A1c MFr Bld: 4.8 % (ref 4.8–5.6)

## 2017-10-25 LAB — TSH: TSH: 2.43 u[IU]/mL (ref 0.450–4.500)

## 2017-10-25 LAB — ABO AND RH: Rh Factor: POSITIVE

## 2017-10-25 LAB — RUBELLA SCREEN: Rubella Antibodies, IGG: 2.28 index (ref >0.99)

## 2017-10-25 LAB — VARICELLA ZOSTER ANTIBODY, IGG: Varicella zoster IgG: 773 index (ref >165)

## 2017-10-25 LAB — ANTIBODY SCREEN: Antibody Screen: NEGATIVE

## 2017-10-26 LAB — GC/CHLAMYDIA PROBE AMP
Chlamydia trachomatis, NAA: NEGATIVE
Neisseria gonorrhoeae by PCR: NEGATIVE

## 2017-10-26 LAB — URINE CULTURE, OB REFLEX

## 2017-10-26 LAB — CULTURE, OB URINE

## 2017-10-27 LAB — MONITOR DRUG PROFILE 14(MW)
Amphetamine Scrn, Ur: NEGATIVE ng/mL
BARBITURATE SCREEN URINE: NEGATIVE ng/mL
BENZODIAZEPINE SCREEN, URINE: NEGATIVE ng/mL
Buprenorphine, Urine: NEGATIVE ng/mL
CANNABINOIDS UR QL SCN: NEGATIVE ng/mL
Cocaine (Metab) Scrn, Ur: NEGATIVE ng/mL
Creatinine(Crt), U: 175.4 mg/dL (ref 20.0–300.0)
Fentanyl, Urine: NEGATIVE pg/mL
Meperidine Screen, Urine: NEGATIVE ng/mL
Methadone Screen, Urine: NEGATIVE ng/mL
OXYCODONE+OXYMORPHONE UR QL SCN: NEGATIVE ng/mL
Opiate Scrn, Ur: NEGATIVE ng/mL
Ph of Urine: 7.5 (ref 4.5–8.9)
Phencyclidine Qn, Ur: NEGATIVE ng/mL
Propoxyphene Scrn, Ur: NEGATIVE ng/mL
SPECIFIC GRAVITY: 1.027
Tramadol Screen, Urine: NEGATIVE ng/mL

## 2017-10-27 LAB — NICOTINE SCREEN, URINE: Cotinine Ql Scrn, Ur: NEGATIVE ng/mL

## 2017-11-13 ENCOUNTER — Other Ambulatory Visit: Payer: Self-pay | Admitting: Obstetrics and Gynecology

## 2017-11-13 DIAGNOSIS — Z369 Encounter for antenatal screening, unspecified: Secondary | ICD-10-CM

## 2017-11-18 ENCOUNTER — Ambulatory Visit (INDEPENDENT_AMBULATORY_CARE_PROVIDER_SITE_OTHER): Payer: 59 | Admitting: Obstetrics and Gynecology

## 2017-11-18 ENCOUNTER — Ambulatory Visit (INDEPENDENT_AMBULATORY_CARE_PROVIDER_SITE_OTHER): Payer: 59

## 2017-11-18 VITALS — BP 125/84 | HR 85 | Wt 154.4 lb

## 2017-11-18 DIAGNOSIS — Z3491 Encounter for supervision of normal pregnancy, unspecified, first trimester: Secondary | ICD-10-CM

## 2017-11-18 DIAGNOSIS — Z369 Encounter for antenatal screening, unspecified: Secondary | ICD-10-CM

## 2017-11-18 DIAGNOSIS — Z3A12 12 weeks gestation of pregnancy: Secondary | ICD-10-CM | POA: Diagnosis not present

## 2017-11-18 DIAGNOSIS — O3411 Maternal care for benign tumor of corpus uteri, first trimester: Secondary | ICD-10-CM

## 2017-11-18 DIAGNOSIS — N8311 Corpus luteum cyst of right ovary: Secondary | ICD-10-CM

## 2017-11-18 LAB — POCT URINALYSIS DIPSTICK OB
Bilirubin, UA: NEGATIVE
Blood, UA: NEGATIVE
Glucose, UA: NEGATIVE
Ketones, UA: NEGATIVE
Leukocytes, UA: NEGATIVE
Nitrite, UA: NEGATIVE
POC,PROTEIN,UA: NEGATIVE
Spec Grav, UA: 1.01 (ref 1.010–1.025)
Urobilinogen, UA: 0.2 E.U./dL
pH, UA: 6.5 (ref 5.0–8.0)

## 2017-11-18 NOTE — Progress Notes (Signed)
NEW OB HISTORY AND PHYSICAL  SUBJECTIVE:       Sarah BenderCatherine A Blankenship is a 22 y.o. [id number] female, Patient's last menstrual period was 08/28/2017., Estimated Date of Delivery: 06/04/18, [id number], presents today for establishment of Prenatal Care. She has no unusual complaints and complains of occasional nausea      Gynecologic History Patient's last menstrual period was 08/28/2017. Normal Contraception: none Last Pap: 10/2016. Results were: normal  Obstetric History OB History  Gravida Para Term Preterm AB Living  1 0 0 0 0 0  SAB TAB Ectopic Multiple Live Births  0 0 0 0 0    # Outcome Date GA Lbr Len/2nd Weight Sex Delivery Anes PTL Lv  1 Current             Past Medical History:  Diagnosis Date  . Seasonal allergies   . Tachycardia     Past Surgical History:  Procedure Laterality Date  . MULTIPLE TOOTH EXTRACTIONS    . NASAL FRACTURE SURGERY      Current Outpatient Medications on File Prior to Visit  Medication Sig Dispense Refill  . cetirizine (ZYRTEC) 10 MG tablet Take 10 mg by mouth daily.    . Prenatal Vit-Fe Fumarate-FA (PRENATAL MULTIVITAMIN) TABS tablet Take 1 tablet by mouth daily at 12 noon.     No current facility-administered medications on file prior to visit.     Allergies  Allergen Reactions  . Cefzil [Cefprozil] Rash    Social History   Socioeconomic History  . Marital status: Single    Spouse name: Not on file  . Number of children: Not on file  . Years of education: Not on file  . Highest education level: Not on file  Occupational History  . Not on file  Social Needs  . Financial resource strain: Not on file  . Food insecurity:    Worry: Not on file    Inability: Not on file  . Transportation needs:    Medical: Not on file    Non-medical: Not on file  Tobacco Use  . Smoking status: Never Smoker  . Smokeless tobacco: Never Used  Substance and Sexual Activity  . Alcohol use: No  . Drug use: No  . Sexual activity: Yes    Birth  control/protection: None  Lifestyle  . Physical activity:    Days per week: Not on file    Minutes per session: Not on file  . Stress: Not on file  Relationships  . Social connections:    Talks on phone: Not on file    Gets together: Not on file    Attends religious service: Not on file    Active member of club or organization: Not on file    Attends meetings of clubs or organizations: Not on file    Relationship status: Not on file  . Intimate partner violence:    Fear of current or ex partner: Not on file    Emotionally abused: Not on file    Physically abused: Not on file    Forced sexual activity: Not on file  Other Topics Concern  . Not on file  Social History Narrative  . Not on file    Family History  Problem Relation Age of Onset  . Thyroid disease Paternal Grandmother   . Diabetes Maternal Grandmother   . Breast cancer Maternal Grandmother   . Diabetes Maternal Grandfather   . Diabetes Mother   . Ovarian cancer Neg Hx   . Colon cancer Neg  Hx     The following portions of the patient's history were reviewed and updated as appropriate: allergies, current medications, past OB history, past medical history, past surgical history, past family history, past social history, and problem list.    OBJECTIVE: Initial Physical Exam (New OB)  GENERAL APPEARANCE: alert, well appearing, in no apparent distress, oriented to person, place and time HEAD: normocephalic, atraumatic MOUTH: mucous membranes moist, pharynx normal without lesions and dental hygiene good THYROID: no thyromegaly or masses present BREASTS: no masses noted, no significant tenderness, no palpable axillary nodes, no skin changes LUNGS: clear to auscultation, no wheezes, rales or rhonchi, symmetric air entry HEART: regular rate and rhythm, no murmurs ABDOMEN: soft, nontender, nondistended, no abnormal masses, no epigastric pain, fundus not palpable and FHT present EXTREMITIES: no redness or tenderness in  the calves or thighs SKIN: normal coloration and turgor, no rashes LYMPH NODES: no adenopathy palpable NEUROLOGIC: alert, oriented, normal speech, no focal findings or movement disorder noted  PELVIC EXAM deferred.  ASSESSMENT: Normal pregnancy  PLAN: Prenatal care Genetic screening obtained today See orders

## 2017-11-18 NOTE — Progress Notes (Signed)
NOB PE- pt is feeling better, pt is going to do genetic testing

## 2017-11-18 NOTE — Patient Instructions (Signed)
Second Trimester of Pregnancy The second trimester is from week 13 through week 28, month 4 through 6. This is often the time in pregnancy that you feel your best. Often times, morning sickness has lessened or quit. You may have more energy, and you may get hungry more often. Your unborn baby (fetus) is growing rapidly. At the end of the sixth month, he or she is about 9 inches long and weighs about 1 pounds. You will likely feel the baby move (quickening) between 18 and 20 weeks of pregnancy. Follow these instructions at home:  Avoid all smoking, herbs, and alcohol. Avoid drugs not approved by your doctor.  Do not use any tobacco products, including cigarettes, chewing tobacco, and electronic cigarettes. If you need help quitting, ask your doctor. You may get counseling or other support to help you quit.  Only take medicine as told by your doctor. Some medicines are safe and some are not during pregnancy.  Exercise only as told by your doctor. Stop exercising if you start having cramps.  Eat regular, healthy meals.  Wear a good support bra if your breasts are tender.  Do not use hot tubs, steam rooms, or saunas.  Wear your seat belt when driving.  Avoid raw meat, uncooked cheese, and liter boxes and soil used by cats.  Take your prenatal vitamins.  Take 1500-2000 milligrams of calcium daily starting at the 20th week of pregnancy until you deliver your baby.  Try taking medicine that helps you poop (stool softener) as needed, and if your doctor approves. Eat more fiber by eating fresh fruit, vegetables, and whole grains. Drink enough fluids to keep your pee (urine) clear or pale yellow.  Take warm water baths (sitz baths) to soothe pain or discomfort caused by hemorrhoids. Use hemorrhoid cream if your doctor approves.  If you have puffy, bulging veins (varicose veins), wear support hose. Raise (elevate) your feet for 15 minutes, 3-4 times a day. Limit salt in your diet.  Avoid heavy  lifting, wear low heals, and sit up straight.  Rest with your legs raised if you have leg cramps or low back pain.  Visit your dentist if you have not gone during your pregnancy. Use a soft toothbrush to brush your teeth. Be gentle when you floss.  You can have sex (intercourse) unless your doctor tells you not to.  Go to your doctor visits. Get help if:  You feel dizzy.  You have mild cramps or pressure in your lower belly (abdomen).  You have a nagging pain in your belly area.  You continue to feel sick to your stomach (nauseous), throw up (vomit), or have watery poop (diarrhea).  You have bad smelling fluid coming from your vagina.  You have pain with peeing (urination). Get help right away if:  You have a fever.  You are leaking fluid from your vagina.  You have spotting or bleeding from your vagina.  You have severe belly cramping or pain.  You lose or gain weight rapidly.  You have trouble catching your breath and have chest pain.  You notice sudden or extreme puffiness (swelling) of your face, hands, ankles, feet, or legs.  You have not felt the baby move in over an hour.  You have severe headaches that do not go away with medicine.  You have vision changes. This information is not intended to replace advice given to you by your health care provider. Make sure you discuss any questions you have with your health care   provider. Document Released: 03/20/2009 Document Revised: 06/01/2015 Document Reviewed: 02/25/2012 Elsevier Interactive Patient Education  2017 [employer]  

## 2017-11-18 NOTE — Addendum Note (Signed)
Addended by: Rosine Beat L on: 11/18/2017 10:44 AM   Modules accepted: Orders

## 2017-11-19 LAB — CBC
Hematocrit: 36.3 % (ref 34.0–46.6)
Hemoglobin: 12.1 g/dL (ref 11.1–15.9)
MCH: 28.8 pg (ref 26.6–33.0)
MCHC: 33.3 g/dL (ref 31.5–35.7)
MCV: 86 fL (ref 79–97)
Platelets: 466 10*3/uL — ABNORMAL HIGH (ref 150–450)
RBC: 4.2 x10E6/uL (ref 3.77–5.28)
RDW: 12 % — ABNORMAL LOW (ref 12.3–15.4)
WBC: 10.3 10*3/uL (ref 3.4–10.8)

## 2017-11-25 ENCOUNTER — Encounter: Payer: Self-pay | Admitting: Obstetrics and Gynecology

## 2017-12-15 ENCOUNTER — Ambulatory Visit (INDEPENDENT_AMBULATORY_CARE_PROVIDER_SITE_OTHER): Payer: 59 | Admitting: Certified Nurse Midwife

## 2017-12-15 VITALS — BP 119/82 | HR 79 | Wt 153.6 lb

## 2017-12-15 DIAGNOSIS — Z3402 Encounter for supervision of normal first pregnancy, second trimester: Secondary | ICD-10-CM

## 2017-12-15 LAB — POCT URINALYSIS DIPSTICK OB
Bilirubin, UA: NEGATIVE
Blood, UA: NEGATIVE
Glucose, UA: NEGATIVE
Ketones, UA: NEGATIVE
Leukocytes, UA: NEGATIVE
Nitrite, UA: NEGATIVE
POC,PROTEIN,UA: NEGATIVE
Spec Grav, UA: 1.01 (ref 1.010–1.025)
Urobilinogen, UA: 0.2 E.U./dL
pH, UA: 8.5 — AB (ref 5.0–8.0)

## 2017-12-15 NOTE — Progress Notes (Signed)
ROB-Doing well, notes increased energy. Panorama results-low risk female, naming her "Sarah Blankenship". Anticipatory guidance regarding course of prenatal care. Discussed round ligament pain and home treatment measures. Reviewed red flag symptoms and when to call. RTC x 4-5 weeks for ANATOMY SCAN and ROB or sooner if needed.

## 2017-12-15 NOTE — Patient Instructions (Signed)
Back Pain in Pregnancy Back pain during pregnancy is common. Back pain may be caused by several factors that are related to changes during your pregnancy. Follow these instructions at home: Managing pain, stiffness, and swelling  If directed, apply ice for sudden (acute) back pain. ? Put ice in a plastic bag. ? Place a towel between your skin and the bag. ? Leave the ice on for 20 minutes, 2-3 times per day.  If directed, apply heat to the affected area before you exercise: ? Place a towel between your skin and the heat pack or heating pad. ? Leave the heat on for 20-30 minutes. ? Remove the heat if your skin turns bright red. This is especially important if you are unable to feel pain, heat, or cold. You may have a greater risk of getting burned. Activity  Exercise as told by your health care provider. Exercising is the best way to prevent or manage back pain.  Listen to your body when lifting. If lifting hurts, ask for help or bend your knees. This uses your leg muscles instead of your back muscles.  Squat down when picking up something from the floor. Do not bend over.  Only use bed rest as told by your health care provider. Bed rest should only be used for the most severe episodes of back pain. Standing, Sitting, and Lying Down  Do not stand in one place for long periods of time.  Use good posture when sitting. Make sure your head rests over your shoulders and is not hanging forward. Use a pillow on your lower back if necessary.  Try sleeping on your side, preferably the left side, with a pillow or two between your legs. If you are sore after a night's rest, your bed may be too soft. A firm mattress may provide more support for your back during pregnancy. General instructions  Do not wear high heels.  Eat a healthy diet. Try to gain weight within your health care provider's recommendations.  Use a maternity girdle, elastic sling, or back brace as told by your health care  provider.  Take over-the-counter and prescription medicines only as told by your health care provider.  Keep all follow-up visits as told by your health care provider. This is important. This includes any visits with any specialists, such as a physical therapist. Contact a health care provider if:  Your back pain interferes with your daily activities.  You have increasing pain in other parts of your body. Get help right away if:  You develop numbness, tingling, weakness, or problems with the use of your arms or legs.  You develop severe back pain that is not controlled with medicine.  You have a sudden change in bowel or bladder control.  You develop shortness of breath, dizziness, or you faint.  You develop nausea, vomiting, or sweating.  You have back pain that is a rhythmic, cramping pain similar to labor pains. Labor pain is usually 1-2 minutes apart, lasts for about 1 minute, and involves a bearing down feeling or pressure in your pelvis.  You have back pain and your water breaks or you have vaginal bleeding.  You have back pain or numbness that travels down your leg.  Your back pain developed after you fell.  You develop pain on one side of your back.  You see blood in your urine.  You develop skin blisters in the area of your back pain. This information is not intended to replace advice given to  you by your health care provider. Make sure you discuss any questions you have with your health care provider. Document Released: 04/03/2005 Document Revised: 06/01/2015 Document Reviewed: 09/07/2014 Elsevier Interactive Patient Education  2018 [employer].  Round Ligament Pain The round ligament is a cord of muscle and tissue that helps to support the uterus. It can become a source of pain during pregnancy if it becomes stretched or twisted as the baby grows. The pain usually begins in the second trimester of pregnancy, and it can come and go until the baby is delivered. It  is not a serious problem, and it does not cause harm to the baby. Round ligament pain is usually a short, sharp, and pinching pain, but it can also be a dull, lingering, and aching pain. The pain is felt in the lower side of the abdomen or in the groin. It usually starts deep in the groin and moves up to the outside of the hip area. Pain can occur with:  A sudden change in position.  Rolling over in bed.  Coughing or sneezing.  Physical activity.  Follow these instructions at home: Watch your condition for any changes. Take these steps to help with your pain:  When the pain starts, relax. Then try: ? Sitting down. ? Flexing your knees up to your abdomen. ? Lying on your side with one pillow under your abdomen and another pillow between your legs. ? Sitting in a warm bath for 15-20 minutes or until the pain goes away.  Take over-the-counter and prescription medicines only as told by your health care provider.  Move slowly when you sit and stand.  Avoid long walks if they cause pain.  Stop or lessen your physical activities if they cause pain.  Contact a health care provider if:  Your pain does not go away with treatment.  You feel pain in your back that you did not have before.  Your medicine is not helping. Get help right away if:  You develop a fever or chills.  You develop uterine contractions.  You develop vaginal bleeding.  You develop nausea or vomiting.  You develop diarrhea.  You have pain when you urinate. This information is not intended to replace advice given to you by your health care provider. Make sure you discuss any questions you have with your health care provider. Document Released: 10/03/2007 Document Revised: 06/01/2015 Document Reviewed: 03/02/2014 Elsevier Interactive Patient Education  2018 [employer]. Common Medications Safe in Pregnancy  Acne:      Constipation:  Benzoyl Peroxide     Colace  Clindamycin      Dulcolax  Suppository  Topica Erythromycin     Fibercon  Salicylic Acid      Metamucil         Miralax AVOID:        Senakot   Accutane    Cough:  Retin-A       Cough Drops  Tetracycline      Phenergan w/ Codeine if Rx  Minocycline      Robitussin (Plain & DM)  Antibiotics:     Crabs/Lice:  Ceclor       RID  Cephalosporins    AVOID:  E-Mycins      Kwell  Keflex  Macrobid/Macrodantin   Diarrhea:  Penicillin      Kao-Pectate  Zithromax      Imodium AD         PUSH FLUIDS AVOID:       Cipro  Fever:  Tetracycline      Tylenol (Regular or Extra  Minocycline       Strength)  Levaquin      Extra Strength-Do not          Exceed 8 tabs/24 hrs Caffeine:        <241m/day (equiv. To 1 cup of coffee or  approx. 3 12 oz sodas)         Gas: Cold/Hayfever:       Gas-X  Benadryl      Mylicon  Claritin       Phazyme  **Claritin-D        Chlor-Trimeton    Headaches:  Dimetapp      ASA-Free Excedrin  Drixoral-Non-Drowsy     Cold Compress  Mucinex (Guaifenasin)     Tylenol (Regular or Extra  Sudafed/Sudafed-12 Hour     Strength)  **Sudafed PE Pseudoephedrine   Tylenol Cold & Sinus     Vicks Vapor Rub  Zyrtec  **AVOID if Problems With Blood Pressure         Heartburn: Avoid lying down for at least 1 hour after meals  Aciphex      Maalox     Rash:  Milk of Magnesia     Benadryl    Mylanta       1% Hydrocortisone Cream  Pepcid  Pepcid Complete   Sleep Aids:  Prevacid      Ambien   Prilosec       Benadryl  Rolaids       Chamomile Tea  Tums (Limit 4/day)     Unisom         Tylenol PM         Warm milk-add vanilla or  Hemorrhoids:       Sugar for taste  Anusol/Anusol H.C.  (RX: Analapram 2.5%)  Sugar Substitutes:  Hydrocortisone OTC     Ok in moderation  Preparation H      Tucks        Vaseline lotion applied to tissue with wiping    Herpes:     Throat:  Acyclovir      Oragel  Famvir  Valtrex     Vaccines:         Flu Shot Leg  Cramps:       *Gardasil  Benadryl      Hepatitis A         Hepatitis B Nasal Spray:       Pneumovax  Saline Nasal Spray     Polio Booster         Tetanus Nausea:       Tuberculosis test or PPD  Vitamin B6 25 mg TID   AVOID:    Dramamine      *Gardasil  Emetrol       Live Poliovirus  Ginger Root 250 mg QID    MMR (measles, mumps &  High Complex Carbs @ Bedtime    rebella)  Sea Bands-Accupressure    Varicella (Chickenpox)  Unisom 1/2 tab TID     *No known complications           If received before Pain:         Known pregnancy;   Darvocet       Resume series after  Lortab        Delivery  Percocet    Yeast:   Tramadol      Femstat  Tylenol 3      Gyne-lotrimin  Ultram  Monistat  Vicodin           MISC:         All Sunscreens           Hair Coloring/highlights          Insect Repellant's          (Including DEET)         Mystic Tans Second Trimester of Pregnancy The second trimester is from week 13 through week 28, month 4 through 6. This is often the time in pregnancy that you feel your best. Often times, morning sickness has lessened or quit. You may have more energy, and you may get hungry more often. Your unborn baby (fetus) is growing rapidly. At the end of the sixth month, he or she is about 9 inches long and weighs about 1 pounds. You will likely feel the baby move (quickening) between 18 and 20 weeks of pregnancy. Follow these instructions at home:  Avoid all smoking, herbs, and alcohol. Avoid drugs not approved by your doctor.  Do not use any tobacco products, including cigarettes, chewing tobacco, and electronic cigarettes. If you need help quitting, ask your doctor. You may get counseling or other support to help you quit.  Only take medicine as told by your doctor. Some medicines are safe and some are not during pregnancy.  Exercise only as told by your doctor. Stop exercising if you start having cramps.  Eat regular, healthy meals.  Wear a good support bra  if your breasts are tender.  Do not use hot tubs, steam rooms, or saunas.  Wear your seat belt when driving.  Avoid raw meat, uncooked cheese, and liter boxes and soil used by cats.  Take your prenatal vitamins.  Take 1500-2000 milligrams of calcium daily starting at the 20th week of pregnancy until you deliver your baby.  Try taking medicine that helps you poop (stool softener) as needed, and if your doctor approves. Eat more fiber by eating fresh fruit, vegetables, and whole grains. Drink enough fluids to keep your pee (urine) clear or pale yellow.  Take warm water baths (sitz baths) to soothe pain or discomfort caused by hemorrhoids. Use hemorrhoid cream if your doctor approves.  If you have puffy, bulging veins (varicose veins), wear support hose. Raise (elevate) your feet for 15 minutes, 3-4 times a day. Limit salt in your diet.  Avoid heavy lifting, wear low heals, and sit up straight.  Rest with your legs raised if you have leg cramps or low back pain.  Visit your dentist if you have not gone during your pregnancy. Use a soft toothbrush to brush your teeth. Be gentle when you floss.  You can have sex (intercourse) unless your doctor tells you not to.  Go to your doctor visits. Get help if:  You feel dizzy.  You have mild cramps or pressure in your lower belly (abdomen).  You have a nagging pain in your belly area.  You continue to feel sick to your stomach (nauseous), throw up (vomit), or have watery poop (diarrhea).  You have bad smelling fluid coming from your vagina.  You have pain with peeing (urination). Get help right away if:  You have a fever.  You are leaking fluid from your vagina.  You have spotting or bleeding from your vagina.  You have severe belly cramping or pain.  You lose or gain weight rapidly.  You have trouble catching your breath and have chest pain.  You notice sudden or  extreme puffiness (swelling) of your face, hands, ankles, feet,  or legs.  You have not felt the baby move in over an hour.  You have severe headaches that do not go away with medicine.  You have vision changes. This information is not intended to replace advice given to you by your health care provider. Make sure you discuss any questions you have with your health care provider. Document Released: 03/20/2009 Document Revised: 06/01/2015 Document Reviewed: 02/25/2012 Elsevier Interactive Patient Education  2017 [employer] WHAT OB PATIENTS CAN EXPECT   Confirmation of pregnancy and ultrasound ordered if medically indicated-[redacted] weeks gestation  New OB (NOB) intake with nurse and New OB (NOB) labs- [redacted] weeks gestation  New OB (NOB) physical examination with provider- 11/[redacted] weeks gestation  Flu vaccine-[redacted] weeks gestation  Anatomy scan-[redacted] weeks gestation  Glucose tolerance test, blood work to test for anemia, T-dap vaccine-[redacted] weeks gestation  Vaginal swabs/cultures-STD/Group B strep-[redacted] weeks gestation  Appointments every 4 weeks until 28 weeks  Every 2 weeks from 28 weeks until 36 weeks  Weekly visits from 36 weeks until delivery

## 2017-12-15 NOTE — Addendum Note (Signed)
Addended by: Brooke DareSICK, Totiana Everson L on: 12/15/2017 11:05 AM   Modules accepted: Orders

## 2018-01-07 NOTE — L&D Delivery Note (Signed)
Delivery Note At  2021 a viable and healthy female """Sarah Blankenship"""was delivered via  (Presentation:OA ;  ).  APGAR:7 ,8 ; weight 4#7oz  .   Placenta status: delivered intact with 3 vessel  Cord, very small:  with the following complications: variable decles, infant to warmer after 4 minutes skin to skin for observation.   Anesthesia: epidural  Episiotomy:  none Lacerations:  2nd Suture Repair: 3.0 vicryl rapide Est. Blood Loss (mL):  115  Mom to postpartum.  Baby to Couplet care / Skin to Skin.  Taiyo Kozma N Sebastiano Luecke 05/28/2018, 8:40 PM

## 2018-01-08 ENCOUNTER — Other Ambulatory Visit: Payer: Self-pay | Admitting: Certified Nurse Midwife

## 2018-01-08 DIAGNOSIS — Z3482 Encounter for supervision of other normal pregnancy, second trimester: Secondary | ICD-10-CM

## 2018-01-13 ENCOUNTER — Ambulatory Visit (INDEPENDENT_AMBULATORY_CARE_PROVIDER_SITE_OTHER): Payer: 59 | Admitting: Certified Nurse Midwife

## 2018-01-13 ENCOUNTER — Ambulatory Visit (INDEPENDENT_AMBULATORY_CARE_PROVIDER_SITE_OTHER): Payer: 59

## 2018-01-13 VITALS — BP 122/76 | HR 88 | Wt 158.4 lb

## 2018-01-13 DIAGNOSIS — Z363 Encounter for antenatal screening for malformations: Secondary | ICD-10-CM | POA: Diagnosis not present

## 2018-01-13 DIAGNOSIS — Z3402 Encounter for supervision of normal first pregnancy, second trimester: Secondary | ICD-10-CM

## 2018-01-13 DIAGNOSIS — Z3482 Encounter for supervision of other normal pregnancy, second trimester: Secondary | ICD-10-CM

## 2018-01-13 LAB — POCT URINALYSIS DIPSTICK OB
Bilirubin, UA: NEGATIVE
Blood, UA: NEGATIVE
Glucose, UA: NEGATIVE
Leukocytes, UA: NEGATIVE
Nitrite, UA: NEGATIVE
POC,PROTEIN,UA: NEGATIVE
Spec Grav, UA: 1.025 (ref 1.010–1.025)
Urobilinogen, UA: 0.2 E.U./dL
pH, UA: 5 (ref 5.0–8.0)

## 2018-01-13 NOTE — Patient Instructions (Signed)

## 2018-01-13 NOTE — Progress Notes (Signed)
ROB doing well. No complaints. Anatomy u/s today. Discussed anterior placenta and perception of fetal movement. Also reviewed incomplete previa, bleeding precautions reviewed. Pt instructed to have nothing in the vagina until told other wise. Anatomy scan incomplete. She will return in 2 wks to complete scan. Will repeat u/s for placenta location at 28 wks.  She verbalizes understanding. ROB with Melody 4 wks.   Patient Name: Sarah BenderCatherine A Marks DOB: April 28, 1995 MRN: [id number]  ULTRASOUND REPORT  Location: [facility] Date of Service: 01/13/2018   Indications:Anatomy Ultrasound Findings:  Mason JimSingleton intrauterine pregnancy is visualized with FHR at 152 BPM. Biometrics give an (U/S) Gestational age of 532w2d and an (U/S) EDD of 05/31/18; this correlates with the clinically established Estimated Date of Delivery: 06/04/18  Fetal presentation is Cephalic.  EFW: wnl. Placenta: anterior. Grade: 0. Placenta  8.27mm covering the cervix (incomplete P.Previa) AFI: subjectively normal.  Anatomic survey is incomplete for 4CH,LVOT,kidneys nose and lips d/t fetal position and normal; Gender - female.    Right Ovary is normal in appearance. Left Ovary is normal appearance. Survey of the adnexa demonstrates no adnexal masses. There is no free peritoneal fluid in the cul de sac.  Impression: 1. 4785w5d Viable Singleton Intrauterine pregnancy by U/S. 2. (U/S) EDD is consistent with Clinically established Estimated Date of Delivery: 06/04/18 . 3. Normal Anatomy Scan  Recommendations: 1.Clinical correlation with the patient's History and Physical Exam.   Abeer Alsammarraie,RDMS

## 2018-01-28 ENCOUNTER — Ambulatory Visit (INDEPENDENT_AMBULATORY_CARE_PROVIDER_SITE_OTHER): Payer: 59

## 2018-01-28 DIAGNOSIS — Z362 Encounter for other antenatal screening follow-up: Secondary | ICD-10-CM | POA: Diagnosis not present

## 2018-01-28 DIAGNOSIS — Z3402 Encounter for supervision of normal first pregnancy, second trimester: Secondary | ICD-10-CM

## 2018-02-04 ENCOUNTER — Ambulatory Visit: Payer: 59 | Admitting: Obstetrics and Gynecology

## 2018-02-04 VITALS — BP 118/72 | HR 88

## 2018-02-04 DIAGNOSIS — J101 Influenza due to other identified influenza virus with other respiratory manifestations: Secondary | ICD-10-CM | POA: Diagnosis not present

## 2018-02-04 DIAGNOSIS — Z331 Pregnant state, incidental: Secondary | ICD-10-CM | POA: Diagnosis not present

## 2018-02-04 LAB — POCT INFLUENZA A/B: Influenza B, POC: POSITIVE — AB

## 2018-02-04 MED ORDER — OSELTAMIVIR PHOSPHATE 75 MG PO CAPS: 75.0000 mg | ORAL_CAPSULE | Freq: Two times a day (BID) | ORAL | 1 refills | Status: DC

## 2018-02-04 NOTE — Progress Notes (Signed)
  Subjective:     Patient ID: Sarah Blankenship, female   DOB: [birth year], 23 y.o.   MRN: [id number]  HPI Here with c/o headache, myalgia, sinus pressure and cough for two days, increased thirst and in general not feeling well, denies known fever.   Review of Systems  Constitutional: Positive for chills and fatigue.  HENT: Positive for congestion, sinus pressure and sore throat.   Respiratory: Positive for cough.   Musculoskeletal: Positive for myalgias.       Objective:   Physical Exam A&Ox4 Fairly groomed female in mild distress/sickly Blood pressure 118/72, pulse 88, last menstrual period 08/28/2017. Nonproductive cough noted, skin warm and dry Abdomen gravid and not tender Rapid flu test + type B    Assessment:     Influenza B [redacted] weeks pregnant    Plan:     Instructed to rest, push fluids, and OK to use tylenol, mucinex, and sudafed as needed. tamiflu 75mg  bid prescribed and to start now.  RTC if symptoms do not improve Note to excuse from work for 48 hours given.(works at [employer])  [employer] MaterialsMelody Daci Stubbe,CNM

## 2018-02-04 NOTE — Progress Notes (Signed)
OB WORK IN-headache, cough, runny nose, had body aches x 2 days ago

## 2018-02-17 ENCOUNTER — Ambulatory Visit (INDEPENDENT_AMBULATORY_CARE_PROVIDER_SITE_OTHER): Payer: 59 | Admitting: Obstetrics and Gynecology

## 2018-02-17 VITALS — BP 128/90 | HR 115 | Wt 161.2 lb

## 2018-02-17 DIAGNOSIS — Z3492 Encounter for supervision of normal pregnancy, unspecified, second trimester: Secondary | ICD-10-CM | POA: Diagnosis not present

## 2018-02-17 LAB — POCT URINALYSIS DIPSTICK OB
Bilirubin, UA: NEGATIVE
Blood, UA: NEGATIVE
Glucose, UA: NEGATIVE
Ketones, UA: NEGATIVE
Leukocytes, UA: NEGATIVE
Nitrite, UA: NEGATIVE
Spec Grav, UA: 1.015 (ref 1.010–1.025)
Urobilinogen, UA: 0.2 E.U./dL
pH, UA: 6 (ref 5.0–8.0)

## 2018-02-17 NOTE — Progress Notes (Signed)
ROB- Pt is doing well, no concerns today.

## 2018-02-17 NOTE — Patient Instructions (Signed)
.  Preeclampsia and Eclampsia    Preeclampsia is a serious condition that may develop during pregnancy. It is also called toxemia of pregnancy. This condition causes high blood pressure along with other symptoms, such as swelling and headaches. These symptoms may develop as the condition gets worse. Preeclampsia may occur at 20 weeks of pregnancy or later.  Diagnosing and treating preeclampsia early is very important. If not treated early, it can cause serious problems for you and your baby. One problem it can lead to is eclampsia. Eclampsia is a condition that causes muscle jerking or shaking (convulsions or seizures) and other serious problems for the mother. During pregnancy, delivering your baby may be the best treatment for preeclampsia or eclampsia. For most women, preeclampsia and eclampsia symptoms go away after giving birth.  In rare cases, a woman may develop preeclampsia after giving birth (postpartum preeclampsia). This usually occurs within 48 hours after childbirth but may occur up to 6 weeks after giving birth.  What are the causes?  The cause of preeclampsia is not known.  What increases the risk?  The following risk factors make you more likely to develop preeclampsia:   Being pregnant for the first time.   Having had preeclampsia during a past pregnancy.   Having a family history of preeclampsia.   Having high blood pressure.   Being pregnant with more than one baby.   Being 35 or older.   Being African-American.   Having kidney disease or diabetes.   Having medical conditions such as lupus or blood diseases.   Being very overweight (obese).  What are the signs or symptoms?  The earliest signs of preeclampsia are:   High blood pressure.   Increased protein in your urine. Your health care provider will check for this at every visit before you give birth (prenatal visit).  Other symptoms that may develop as the condition gets worse include:   Severe headaches.   Sudden weight  gain.   Swelling of the hands, face, legs, and feet.   Nausea and vomiting.   Vision problems, such as blurred or double vision.   Numbness in the face, arms, legs, and feet.   Urinating less than usual.   Dizziness.   Slurred speech.   Abdominal pain, especially upper abdominal pain.   Convulsions or seizures.  How is this diagnosed?  There are no screening tests for preeclampsia. Your health care provider will ask you about symptoms and check for signs of preeclampsia during your prenatal visits. You may also have tests that include:   Urine tests.   Blood tests.   Checking your blood pressure.   Monitoring your baby's heart rate.   Ultrasound.  How is this treated?  You and your health care provider will determine the treatment approach that is best for you. Treatment may include:   Having more frequent prenatal exams to check for signs of preeclampsia, if you have an increased risk for preeclampsia.   Medicine to lower your blood pressure.   Staying in the hospital, if your condition is severe. There, treatment will focus on controlling your blood pressure and the amount of fluids in your body (fluid retention).   Taking medicine (magnesium sulfate) to prevent seizures. This may be given as an injection or through an IV.   Taking a low-dose aspirin during your pregnancy.   Delivering your baby early, if your condition gets worse. You may have your labor started with medicine (induced), or you may have a cesarean   delivery.  Follow these instructions at home:  Eating and drinking     Drink enough fluid to keep your urine pale yellow.   Avoid caffeine.  Lifestyle   Do not use any products that contain nicotine or tobacco, such as cigarettes and e-cigarettes. If you need help quitting, ask your health care provider.   Do not use alcohol or drugs.   Avoid stress as much as possible. Rest and get plenty of sleep.  General instructions   Take over-the-counter and prescription medicines only as  told by your health care provider.   When lying down, lie on your left side. This keeps pressure off your major blood vessels.   When sitting or lying down, raise (elevate) your feet. Try putting some pillows underneath your lower legs.   Exercise regularly. Ask your health care provider what kinds of exercise are best for you.   Keep all follow-up and prenatal visits as told by your health care provider. This is important.  How is this prevented?  There is no known way of preventing preeclampsia or eclampsia from developing. However, to lower your risk of complications and detect problems early:   Get regular prenatal care. Your health care provider may be able to diagnose and treat the condition early.   Maintain a healthy weight. Ask your health care provider for help managing weight gain during pregnancy.   Work with your health care provider to manage any long-term (chronic) health conditions you have, such as diabetes or kidney problems.   You may have tests of your blood pressure and kidney function after giving birth.   Your health care provider may have you take low-dose aspirin during your next pregnancy.  Contact a health care provider if:   You have symptoms that your health care provider told you may require more treatment or monitoring, such as:  ? Headaches.  ? Nausea or vomiting.  ? Abdominal pain.  ? Dizziness.  ? Light-headedness.  Get help right away if:   You have severe:  ? Abdominal pain.  ? Headaches that do not get better.  ? Dizziness.  ? Vision problems.  ? Confusion.  ? Nausea or vomiting.   You have any of the following:  ? A seizure.  ? Sudden, rapid weight gain.  ? Sudden swelling in your hands, ankles, or face.  ? Trouble moving any part of your body.  ? Numbness in any part of your body.  ? Trouble speaking.  ? Abnormal bleeding.   You faint.  Summary   Preeclampsia is a serious condition that may develop during pregnancy. It is also called toxemia of pregnancy.   This  condition causes high blood pressure along with other symptoms, such as swelling and headaches.   Diagnosing and treating preeclampsia early is very important. If not treated early, it can cause serious problems for you and your baby.   Get help right away if you have symptoms that your health care provider told you to watch for.  This information is not intended to replace advice given to you by your health care provider. Make sure you discuss any questions you have with your health care provider.  Document Released: 12/22/1999 Document Revised: 12/10/2016 Document Reviewed: 07/31/2015  Elsevier Interactive Patient Education  2019 Elsevier Inc.

## 2018-02-17 NOTE — Progress Notes (Signed)
ROB-doing well, watch BPs, PIH precautions discussed- will check BP at work and send results via MyChart. Growth scan and placental check with glucola next visit.

## 2018-03-12 ENCOUNTER — Other Ambulatory Visit: Payer: Self-pay

## 2018-03-12 DIAGNOSIS — Z3492 Encounter for supervision of normal pregnancy, unspecified, second trimester: Secondary | ICD-10-CM

## 2018-03-17 ENCOUNTER — Other Ambulatory Visit: Payer: 59

## 2018-03-17 ENCOUNTER — Ambulatory Visit: Payer: 59 | Admitting: Certified Nurse Midwife

## 2018-03-17 VITALS — BP 115/84 | HR 76 | Wt 168.4 lb

## 2018-03-17 DIAGNOSIS — Z3493 Encounter for supervision of normal pregnancy, unspecified, third trimester: Secondary | ICD-10-CM | POA: Diagnosis not present

## 2018-03-17 MED ORDER — TETANUS-DIPHTH-ACELL PERTUSSIS 5-2.5-18.5 LF-MCG/0.5 IM SUSP
0.5000 mL | Freq: Once | INTRAMUSCULAR | Status: AC
  Administered 2018-03-17: 0.5 mL via INTRAMUSCULAR

## 2018-03-17 NOTE — Patient Instructions (Addendum)
WE WOULD LOVE TO HEAR FROM YOU!!!!   Thank you Sarah Blankenship for visiting [facility].  Providing our patients with the best experience possible is really important to [place], and we hope that you felt that on your recent visit. The most valuable feedback we get comes from [place]!!    If you receive a survey please take a couple of minutes to let us know how we did.Thank you for continuing to trust [place] with your care.   [facility]   Back Pain in Pregnancy Back pain during pregnancy is common. Back pain may be caused by several factors that are related to changes during your pregnancy. Follow these instructions at home: Managing pain, stiffness, and swelling      If directed, for sudden (acute) back pain, put ice on the painful area. ? Put ice in a plastic bag. ? Place a towel between your skin and the bag. ? Leave the ice on for 20 minutes, 2-3 times per day.  If directed, apply heat to the affected area before you exercise. Use the heat source that your health care provider recommends, such as a moist heat pack or a heating pad. ? Place a towel between your skin and the heat source. ? Leave the heat on for 20-30 minutes. ? Remove the heat if your skin turns bright red. This is especially important if you are unable to feel pain, heat, or cold. You may have a greater risk of getting burned.  If directed, massage the affected area. Activity  Exercise as told by your health care provider. Gentle exercise is the best way to prevent or manage back pain.  Listen to your body when lifting. If lifting hurts, ask for help or bend your knees. This uses your leg muscles instead of your back muscles.  Squat down when picking up something from the floor. Do not bend over.  Only use bed rest for short periods as told by your health care provider. Bed rest should only be used for the most severe episodes of back pain. Standing, sitting, and lying  down  Do not stand in one place for long periods of time.  Use good posture when sitting. Make sure your head rests over your shoulders and is not hanging forward. Use a pillow on your lower back if necessary.  Try sleeping on your side, preferably the left side, with a pregnancy support pillow or 1-2 regular pillows between your legs. ? If you have back pain after a night's rest, your bed may be too soft. ? A firm mattress may provide more support for your back during pregnancy. General instructions  Do not wear high heels.  Eat a healthy diet. Try to gain weight within your health care provider's recommendations.  Use a maternity girdle, elastic sling, or back brace as told by your health care provider.  Take over-the-counter and prescription medicines only as told by your health care provider.  Work with a physical therapist or massage therapist to find ways to manage back pain. Acupuncture or massage therapy may be helpful.  Keep all follow-up visits as told by your health care provider. This is important. Contact a health care provider if:  Your back pain interferes with your daily activities.  You have increasing pain in other parts of your body. Get help right away if:  You develop numbness, tingling, weakness, or problems with the use of your arms or legs.  You develop severe back pain that is  not controlled with medicine.  You have a change in bowel or bladder control.  You develop shortness of breath, dizziness, or you faint.  You develop nausea, vomiting, or sweating.  You have back pain that is a rhythmic, cramping pain similar to labor pains. Labor pain is usually 1-2 minutes apart, lasts for about 1 minute, and involves a bearing down feeling or pressure in your pelvis.  You have back pain and your water breaks or you have vaginal bleeding.  You have back pain or numbness that travels down your leg.  Your back pain developed after you fell.  You develop  pain on one side of your back.  You see blood in your urine.  You develop skin blisters in the area of your back pain. Summary  Back pain may be caused by several factors that are related to changes during your pregnancy.  Follow instructions as told by your health care provider for managing pain, stiffness, and swelling.  Exercise as told by your health care provider. Gentle exercise is the best way to prevent or manage back pain.  Take over-the-counter and prescription medicines only as told by your health care provider.  Keep all follow-up visits as told by your health care provider. This is important. This information is not intended to replace advice given to you by your health care provider. Make sure you discuss any questions you have with your health care provider. Document Released: 04/03/2005 Document Revised: 06/11/2017 Document Reviewed: 06/11/2017 Elsevier Interactive Patient Education  2019 [employer].   Common Medications Safe in Pregnancy  Acne:      Constipation:  Benzoyl Peroxide     Colace  Clindamycin      Dulcolax Suppository  Topica Erythromycin     Fibercon  Salicylic Acid      Metamucil         Miralax AVOID:        Senakot   Accutane    Cough:  Retin-A       Cough Drops  Tetracycline      Phenergan w/ Codeine if Rx  Minocycline      Robitussin (Plain & DM)  Antibiotics:     Crabs/Lice:  Ceclor       RID  Cephalosporins    AVOID:  E-Mycins      Kwell  Keflex  Macrobid/Macrodantin   Diarrhea:  Penicillin      Kao-Pectate  Zithromax      Imodium AD         PUSH FLUIDS AVOID:       Cipro     Fever:  Tetracycline      Tylenol (Regular or Extra  Minocycline       Strength)  Levaquin      Extra Strength-Do not          Exceed 8 tabs/24 hrs Caffeine:        <257m/day (equiv. To 1 cup of coffee or  approx. 3 12 oz  sodas)         Gas: Cold/Hayfever:       Gas-X  Benadryl      Mylicon  Claritin       Phazyme  **Claritin-D        Chlor-Trimeton    Headaches:  Dimetapp      ASA-Free Excedrin  Drixoral-Non-Drowsy     Cold Compress  Mucinex (Guaifenasin)     Tylenol (Regular or Extra  Sudafed/Sudafed-12 Hour     Strength)  **Sudafed PE  Pseudoephedrine   Tylenol Cold & Sinus     Vicks Vapor Rub  Zyrtec  **AVOID if Problems With Blood Pressure         Heartburn: Avoid lying down for at least 1 hour after meals  Aciphex      Maalox     Rash:  Milk of Magnesia     Benadryl    Mylanta       1% Hydrocortisone Cream  Pepcid  Pepcid Complete   Sleep Aids:  Prevacid      Ambien   Prilosec       Benadryl  Rolaids       Chamomile Tea  Tums (Limit 4/day)     Unisom  Zantac       Tylenol PM         Warm milk-add vanilla or  Hemorrhoids:       Sugar for taste  Anusol/Anusol H.C.  (RX: Analapram 2.5%)  Sugar Substitutes:  Hydrocortisone OTC     Ok in moderation  Preparation H      Tucks        Vaseline lotion applied to tissue with wiping    Herpes:     Throat:  Acyclovir      Oragel  Famvir  Valtrex     Vaccines:         Flu Shot Leg Cramps:       *Gardasil  Benadryl      Hepatitis A         Hepatitis B Nasal Spray:       Pneumovax  Saline Nasal Spray     Polio Booster         Tetanus Nausea:       Tuberculosis test or PPD  Vitamin B6 25 mg TID   AVOID:    Dramamine      *Gardasil  Emetrol       Live Poliovirus  Ginger Root 250 mg QID    MMR (measles, mumps &  High Complex Carbs @ Bedtime    rebella)  Sea Bands-Accupressure    Varicella (Chickenpox)  Unisom 1/2 tab TID     *No known complications           If received before Pain:         Known pregnancy;   Darvocet       Resume series after  Lortab        Delivery  Percocet    Yeast:   Tramadol      Femstat  Tylenol 3      Gyne-lotrimin  Ultram       Monistat  Vicodin           MISC:         All  Sunscreens           Hair Coloring/highlights          Insect Repellant's          (Including DEET)         Mystic Tans   WHAT OB PATIENTS CAN EXPECT   Confirmation of pregnancy and ultrasound ordered if medically indicated-[redacted] weeks gestation  New OB (NOB) intake with nurse and New OB (NOB) labs- [redacted] weeks gestation  New OB (NOB) physical examination with provider- 11/[redacted] weeks gestation  Flu vaccine-[redacted] weeks gestation  Anatomy scan-[redacted] weeks gestation  Glucose tolerance test, blood work to test for anemia, T-dap vaccine-[redacted] weeks gestation  Vaginal swabs/cultures-STD/Group B strep-[redacted] weeks gestation  Appointments  every 4 weeks until 28 weeks  Every 2 weeks from 28 weeks until 36 weeks  Weekly visits from 36 weeks until delivery    Third Trimester of Pregnancy  The third trimester is from week 28 through week 40 (months 7 through 9). This trimester is when your unborn baby (fetus) is growing very fast. At the end of the ninth month, the unborn baby is about 20 inches in length. It weighs about 6-10 pounds. Follow these instructions at home: Medicines  Take over-the-counter and prescription medicines only as told by your doctor. Some medicines are safe and some medicines are not safe during pregnancy.  Take a prenatal vitamin that contains at least 600 micrograms (mcg) of folic acid.  If you have trouble pooping (constipation), take medicine that will make your stool soft (stool softener) if your doctor approves. Eating and drinking   Eat regular, healthy meals.  Avoid raw meat and uncooked cheese.  If you get low calcium from the food you eat, talk to your doctor about taking a daily calcium supplement.  Eat four or five small meals rather than three large meals a day.  Avoid foods that are high in fat and sugars, such as fried and sweet foods.  To prevent constipation: ? Eat foods that are high in fiber, like fresh fruits and vegetables, whole grains, and  beans. ? Drink enough fluids to keep your pee (urine) clear or pale yellow. Activity  Exercise only as told by your doctor. Stop exercising if you start to have cramps.  Avoid heavy lifting, wear low heels, and sit up straight.  Do not exercise if it is too hot, too humid, or if you are in a place of great height (high altitude).  You may continue to have sex unless your doctor tells you not to. Relieving pain and discomfort  Wear a good support bra if your breasts are tender.  Take frequent breaks and rest with your legs raised if you have leg cramps or low back pain.  Take warm water baths (sitz baths) to soothe pain or discomfort caused by hemorrhoids. Use hemorrhoid cream if your doctor approves.  If you develop puffy, bulging veins (varicose veins) in your legs: ? Wear support hose or compression stockings as told by your doctor. ? Raise (elevate) your feet for 15 minutes, 3-4 times a day. ? Limit salt in your food. Safety  Wear your seat belt when driving.  Make a list of emergency phone numbers, including numbers for family, friends, the hospital, and police and fire departments. Preparing for your baby's arrival To prepare for the arrival of your baby:  Take prenatal classes.  Practice driving to the hospital.  Visit the hospital and tour the maternity area.  Talk to your work about taking leave once the baby comes.  Pack your hospital bag.  Prepare the baby's room.  Go to your doctor visits.  Buy a rear-facing car seat. Learn how to install it in your car. General instructions  Do not use hot tubs, steam rooms, or saunas.  Do not use any products that contain nicotine or tobacco, such as cigarettes and e-cigarettes. If you need help quitting, ask your doctor.  Do not drink alcohol.  Do not douche or use tampons or scented sanitary pads.  Do not cross your legs for long periods of time.  Do not travel for long distances unless you must. Only do so if  your doctor says it is okay.  Visit your dentist  if you have not gone during your pregnancy. Use a soft toothbrush to brush your teeth. Be gentle when you floss.  Avoid cat litter boxes and soil used by cats. These carry germs that can cause birth defects in the baby and can cause a loss of your baby (miscarriage) or stillbirth.  Keep all your prenatal visits as told by your doctor. This is important. Contact a doctor if:  You are not sure if you are in labor or if your water has broken.  You are dizzy.  You have mild cramps or pressure in your lower belly.  You have a nagging pain in your belly area.  You continue to feel sick to your stomach, you throw up, or you have watery poop.  You have bad smelling fluid coming from your vagina.  You have pain when you pee. Get help right away if:  You have a fever.  You are leaking fluid from your vagina.  You are spotting or bleeding from your vagina.  You have severe belly cramps or pain.  You lose or gain weight quickly.  You have trouble catching your breath and have chest pain.  You notice sudden or extreme puffiness (swelling) of your face, hands, ankles, feet, or legs.  You have not felt the baby move in over an hour.  You have severe headaches that do not go away with medicine.  You have trouble seeing.  You are leaking, or you are having a gush of fluid, from your vagina before you are 37 weeks.  You have regular belly spasms (contractions) before you are 37 weeks. Summary  The third trimester is from week 28 through week 40 (months 7 through 9). This time is when your unborn baby is growing very fast.  Follow your doctor's advice about medicine, food, and activity.  Get ready for the arrival of your baby by taking prenatal classes, getting all the baby items ready, preparing the baby's room, and visiting your doctor to be checked.  Get help right away if you are bleeding from your vagina, or you have chest pain  and trouble catching your breath, or if you have not felt your baby move in over an hour. This information is not intended to replace advice given to you by your health care provider. Make sure you discuss any questions you have with your health care provider. Document Released: 03/20/2009 Document Revised: 01/30/2016 Document Reviewed: 01/30/2016 Elsevier Interactive Patient Education  2019 [employer].

## 2018-03-17 NOTE — Progress Notes (Signed)
ROB-Reports round ligament pain, waddling and increased pelvic pressure. Discussed home treatment measures including use of abdominal support. 28 week labs today. TDaP given. Blood transfusion consent reviewed and signed. Anticipatory guidance regarding course of prenatal care. Third trimester handouts and class scheduled given. Reviewed red flag symptoms and when to call. RTC x 2 weeks for ROB or sooner if needed.

## 2018-03-18 ENCOUNTER — Other Ambulatory Visit: Payer: Self-pay | Admitting: Certified Nurse Midwife

## 2018-03-18 LAB — CBC
Hematocrit: 32.2 % — ABNORMAL LOW (ref 34.0–46.6)
Hemoglobin: 10.9 g/dL — ABNORMAL LOW (ref 11.1–15.9)
MCH: 29.9 pg (ref 26.6–33.0)
MCHC: 33.9 g/dL (ref 31.5–35.7)
MCV: 88 fL (ref 79–97)
Platelets: 429 10*3/uL (ref 150–450)
RBC: 3.65 x10E6/uL — ABNORMAL LOW (ref 3.77–5.28)
RDW: 12.6 % (ref 11.7–15.4)
WBC: 13 10*3/uL — ABNORMAL HIGH (ref 3.4–10.8)

## 2018-03-18 LAB — RPR: RPR Ser Ql: NONREACTIVE

## 2018-03-18 LAB — GLUCOSE, 1 HOUR GESTATIONAL: Gestational Diabetes Screen: 100 mg/dL (ref 65–139)

## 2018-03-18 MED ORDER — FUSION PLUS PO CAPS: 1.0000 | ORAL_CAPSULE | Freq: Every day | ORAL | 6 refills | Status: DC

## 2018-03-18 NOTE — Progress Notes (Signed)
Order placed for fusion plus due to anemia.   Sarah Blankenship, CNM  

## 2018-03-25 ENCOUNTER — Other Ambulatory Visit: Payer: Self-pay

## 2018-03-25 ENCOUNTER — Ambulatory Visit
Admission: RE | Admit: 2018-03-25 | Discharge: 2018-03-25 | Disposition: A | Discharge status: Home / Self Care | Payer: 59 | Source: Ambulatory Visit | Attending: Obstetrics and Gynecology | Admitting: Obstetrics and Gynecology

## 2018-03-25 DIAGNOSIS — Z3A29 29 weeks gestation of pregnancy: Secondary | ICD-10-CM | POA: Diagnosis not present

## 2018-03-25 DIAGNOSIS — Z3492 Encounter for supervision of normal pregnancy, unspecified, second trimester: Secondary | ICD-10-CM | POA: Diagnosis not present

## 2018-03-25 DIAGNOSIS — Z3493 Encounter for supervision of normal pregnancy, unspecified, third trimester: Secondary | ICD-10-CM | POA: Diagnosis not present

## 2018-03-30 ENCOUNTER — Encounter: Payer: Self-pay | Admitting: *Deleted

## 2018-03-30 NOTE — Progress Notes (Signed)
Coronavirus (COVID-19) Are you at risk?  Are you at risk for the Coronavirus (COVID-19)?  To be considered HIGH RISK for Coronavirus (COVID-19), you have to meet the following criteria:  . Traveled to [place], [place], [place], [place] or [place]; or in the [place] to [place], [place], [place], or [place]; and have fever, cough, and shortness of breath within the last 2 weeks of travel OR . Been in close contact with a person diagnosed with COVID-19 within the last 2 weeks and have fever, cough, and shortness of breath . IF YOU DO NOT MEET THESE CRITERIA, YOU ARE CONSIDERED LOW RISK FOR COVID-19.  What to do if you are HIGH RISK for COVID-19?  Marland Kitchen If you are having a medical emergency, call 911. . Seek medical care right away. Before you go to a doctor's office, urgent care or emergency department, call ahead and tell them about your recent travel, contact with someone diagnosed with COVID-19, and your symptoms. You should receive instructions from your physician's office regarding next steps of care.  . When you arrive at healthcare provider, tell the healthcare staff immediately you have returned from visiting [place], [place], [place], [place] or [place]; or traveled in the [place] to [place], [place], [place], or [place]; in the last two weeks or you have been in close contact with a person diagnosed with COVID-19 in the last 2 weeks.   . Tell the health care staff about your symptoms: fever, cough and shortness of breath. . After you have been seen by a medical provider, you will be either: o Tested for (COVID-19) and discharged home on quarantine except to seek medical care if symptoms worsen, and asked to  - Stay home and avoid contact with others until you get your results (4-5 days)  - Avoid travel on public transportation if possible (such as bus, train, or airplane) or o Sent to the Emergency Department by EMS for evaluation, COVID-19 testing, and possible  admission depending on your condition and test results.  What to do if you are LOW RISK for COVID-19?  Reduce your risk of any infection by using the same precautions used for avoiding the common cold or flu:  Marland Kitchen Wash your hands often with soap and warm water for at least 20 seconds.  If soap and water are not readily available, use an alcohol-based hand sanitizer with at least 60% alcohol.  . If coughing or sneezing, cover your mouth and nose by coughing or sneezing into the elbow areas of your shirt or coat, into a tissue or into your sleeve (not your hands). . Avoid shaking hands with others and consider head nods or verbal greetings only. . Avoid touching your eyes, nose, or mouth with unwashed hands.  . Avoid close contact with people who are sick. . Avoid places or events with large numbers of people in one location, like concerts or sporting events. . Carefully consider travel plans you have or are making. . If you are planning any travel outside or inside the [place], visit the CDC's Travelers' Health webpage for the latest health notices. . If you have some symptoms but not all symptoms, continue to monitor at home and seek medical attention if your symptoms worsen. . If you are having a medical emergency, call 911.   Spoke with pt and she denies any sx.  Rosine Beat, CMA   ADDITIONAL HEALTHCARE OPTIONS FOR PATIENTS  [facility] [facility] / e-Visit: [email or web address]  [facility]: [phone]  [facility]: [phone]                   [facility]: [phone]  

## 2018-03-31 ENCOUNTER — Other Ambulatory Visit: Payer: Self-pay

## 2018-03-31 ENCOUNTER — Ambulatory Visit: Payer: 59 | Admitting: Certified Nurse Midwife

## 2018-03-31 VITALS — BP 115/89 | HR 80 | Wt 169.4 lb

## 2018-03-31 DIAGNOSIS — Z3493 Encounter for supervision of normal pregnancy, unspecified, third trimester: Secondary | ICD-10-CM

## 2018-03-31 LAB — POCT URINALYSIS DIPSTICK OB
Bilirubin, UA: NEGATIVE
Glucose, UA: NEGATIVE
Ketones, UA: NEGATIVE
Leukocytes, UA: NEGATIVE
Nitrite, UA: NEGATIVE
POC,PROTEIN,UA: NEGATIVE
RBC UA: NEGATIVE
Spec Grav, UA: <=1.005 — AB (ref 1.010–1.025)
Urobilinogen, UA: 0.2 E.U./dL
pH, UA: 8.5 — AB (ref 5.0–8.0)

## 2018-03-31 NOTE — Patient Instructions (Signed)

## 2018-03-31 NOTE — Progress Notes (Signed)
ROB doing well. Feels good movement. Discussed spacing out of visit due to COVID pt verbalizes understanding. Red falg symptoms reviewed follow up @ 36 wks.   Doreene Burke, CNM

## 2018-04-22 DIAGNOSIS — Z3483 Encounter for supervision of other normal pregnancy, third trimester: Secondary | ICD-10-CM | POA: Diagnosis not present

## 2018-04-22 DIAGNOSIS — Z3482 Encounter for supervision of other normal pregnancy, second trimester: Secondary | ICD-10-CM | POA: Diagnosis not present

## 2018-05-04 ENCOUNTER — Telehealth: Payer: Self-pay

## 2018-05-04 NOTE — Telephone Encounter (Signed)
Coronavirus (COVID-19) Are you at risk?  Are you at risk for the Coronavirus (COVID-19)?  To be considered HIGH RISK for Coronavirus (COVID-19), you have to meet the following criteria:  . Traveled to [place], [place], [place], [place] or [place]; or in the [place] to [place], [place], [place], or [place]; and have fever, cough, and shortness of breath within the last 2 weeks of travel OR . Been in close contact with a person diagnosed with COVID-19 within the last 2 weeks and have fever, cough, and shortness of breath . IF YOU DO NOT MEET THESE CRITERIA, YOU ARE CONSIDERED LOW RISK FOR COVID-19.  What to do if you are HIGH RISK for COVID-19?  . If you are having a medical emergency, call 911. . Seek medical care right away. Before you go to a doctor's office, urgent care or emergency department, call ahead and tell them about your recent travel, contact with someone diagnosed with COVID-19, and your symptoms. You should receive instructions from your physician's office regarding next steps of care.  . When you arrive at healthcare provider, tell the healthcare staff immediately you have returned from visiting [place], [place], [place], [place] or [place]; or traveled in the [place] to [place], [place], [place], or [place]; in the last two weeks or you have been in close contact with a person diagnosed with COVID-19 in the last 2 weeks.   . Tell the health care staff about your symptoms: fever, cough and shortness of breath. . After you have been seen by a medical provider, you will be either: o Tested for (COVID-19) and discharged home on quarantine except to seek medical care if symptoms worsen, and asked to  - Stay home and avoid contact with others until you get your results (4-5 days)  - Avoid travel on public transportation if possible (such as bus, train, or airplane) or o Sent to the Emergency Department by EMS for evaluation, COVID-19 testing, and possible  admission depending on your condition and test results.  What to do if you are LOW RISK for COVID-19?  Reduce your risk of any infection by using the same precautions used for avoiding the common cold or flu:  . Wash your hands often with soap and warm water for at least 20 seconds.  If soap and water are not readily available, use an alcohol-based hand sanitizer with at least 60% alcohol.  . If coughing or sneezing, cover your mouth and nose by coughing or sneezing into the elbow areas of your shirt or coat, into a tissue or into your sleeve (not your hands). . Avoid shaking hands with others and consider head nods or verbal greetings only. . Avoid touching your eyes, nose, or mouth with unwashed hands.  . Avoid close contact with people who are Latiqua Daloia. . Avoid places or events with large numbers of people in one location, like concerts or sporting events. . Carefully consider travel plans you have or are making. . If you are planning any travel outside or inside the [place], visit the CDC's Travelers' Health webpage for the latest health notices. . If you have some symptoms but not all symptoms, continue to monitor at home and seek medical attention if your symptoms worsen. . If you are having a medical emergency, call 911.  05/04/18 SCREENING NEG SLS ADDITIONAL HEALTHCARE OPTIONS FOR PATIENTS  [facility] / e-Visit: [email or web address]         [facility]: [phone]    [facility]: [phone]                   [facility]: [phone]  

## 2018-05-04 NOTE — Telephone Encounter (Signed)
Coronavirus (COVID-19) Are you at risk?  Are you at risk for the Coronavirus (COVID-19)?  To be considered HIGH RISK for Coronavirus (COVID-19), you have to meet the following criteria:  . Traveled to [place], [place], [place], [place] or [place]; or in the [place] to [place], [place], [place], or [place]; and have fever, cough, and shortness of breath within the last 2 weeks of travel OR . Been in close contact with a person diagnosed with COVID-19 within the last 2 weeks and have fever, cough, and shortness of breath . IF YOU DO NOT MEET THESE CRITERIA, YOU ARE CONSIDERED LOW RISK FOR COVID-19.  What to do if you are HIGH RISK for COVID-19?  . If you are having a medical emergency, call 911. . Seek medical care right away. Before you go to a doctor's office, urgent care or emergency department, call ahead and tell them about your recent travel, contact with someone diagnosed with COVID-19, and your symptoms. You should receive instructions from your physician's office regarding next steps of care.  . When you arrive at healthcare provider, tell the healthcare staff immediately you have returned from visiting [place], [place], [place], [place] or [place]; or traveled in the [place] to [place], [place], [place], or [place]; in the last two weeks or you have been in close contact with a person diagnosed with COVID-19 in the last 2 weeks.   . Tell the health care staff about your symptoms: fever, cough and shortness of breath. . After you have been seen by a medical provider, you will be either: o Tested for (COVID-19) and discharged home on quarantine except to seek medical care if symptoms worsen, and asked to  - Stay home and avoid contact with others until you get your results (4-5 days)  - Avoid travel on public transportation if possible (such as bus, train, or airplane) or o Sent to the Emergency Department by EMS for evaluation, COVID-19 testing, and possible  admission depending on your condition and test results.  What to do if you are LOW RISK for COVID-19?  Reduce your risk of any infection by using the same precautions used for avoiding the common cold or flu:  . Wash your hands often with soap and warm water for at least 20 seconds.  If soap and water are not readily available, use an alcohol-based hand sanitizer with at least 60% alcohol.  . If coughing or sneezing, cover your mouth and nose by coughing or sneezing into the elbow areas of your shirt or coat, into a tissue or into your sleeve (not your hands). . Avoid shaking hands with others and consider head nods or verbal greetings only. . Avoid touching your eyes, nose, or mouth with unwashed hands.  . Avoid close contact with people who are Sarah Blankenship. . Avoid places or events with large numbers of people in one location, like concerts or sporting events. . Carefully consider travel plans you have or are making. . If you are planning any travel outside or inside the [place], visit the CDC's Travelers' Health webpage for the latest health notices. . If you have some symptoms but not all symptoms, continue to monitor at home and seek medical attention if your symptoms worsen. . If you are having a medical emergency, call 911.  05/04/18 SCREENING NEG SLS ADDITIONAL HEALTHCARE OPTIONS FOR PATIENTS  [facility] / e-Visit: [email or web address]         [facility]: [phone]    [facility]: [phone]                   [facility]: [phone]  

## 2018-05-05 ENCOUNTER — Other Ambulatory Visit: Payer: Self-pay

## 2018-05-05 ENCOUNTER — Ambulatory Visit: Payer: 59 | Admitting: Obstetrics and Gynecology

## 2018-05-05 VITALS — BP 120/78 | HR 98 | Wt 178.0 lb

## 2018-05-05 DIAGNOSIS — Z3685 Encounter for antenatal screening for Streptococcus B: Secondary | ICD-10-CM

## 2018-05-05 DIAGNOSIS — Z3493 Encounter for supervision of normal pregnancy, unspecified, third trimester: Secondary | ICD-10-CM

## 2018-05-05 DIAGNOSIS — Z113 Encounter for screening for infections with a predominantly sexual mode of transmission: Secondary | ICD-10-CM

## 2018-05-05 LAB — POCT URINALYSIS DIPSTICK OB
Bilirubin, UA: NEGATIVE
Blood, UA: NEGATIVE
Glucose, UA: NEGATIVE
Ketones, UA: NEGATIVE
Nitrite, UA: NEGATIVE
POC,PROTEIN,UA: NEGATIVE
Spec Grav, UA: 1.01 (ref 1.010–1.025)
Urobilinogen, UA: 0.2 E.U./dL
pH, UA: 7 (ref 5.0–8.0)

## 2018-05-05 NOTE — Progress Notes (Signed)
ROB- cultures obtained, pt is having pelvic pressure 

## 2018-05-05 NOTE — Progress Notes (Signed)
ROB- cultures obtained, anemia panel repeated,labor precautions discussed. Discussed covid 19. Spouse as labor support. Considering Mirena PP, never used HC in past.

## 2018-05-05 NOTE — Patient Instructions (Signed)
FREQUENTLY ASKED QUESTIONS FOR OBSTETRICS/PEDIATRICS    Q: Why are visitor restrictions different for maternity care areas?  [place] is restricting visitors for the duration of the patient's hospitalization. The birth of a child involves the mother, considered the patient, and a birthing partner. These are unprecedented times and we are making the exception to allow a birthing partner to be a part of the patient unit. No other guests will be allowed in our [place] at [facility] and at [facility].   Q: Are credentialed doulas allowed to support their existing patients?  We acknowledge the value these doula partnerships offer our care teams and many birthing families in our communities. Each laboring mother is allowed one birthing partner of the patient's choosing for her entire hospitalization.   Q: Are visitor restrictions different for hospitalized children?  Pediatric patients (infants and children under 50 years of age), such as those in the Children's Unit, Pediatric ICU and NICU, will be allowed two visitors (parents or legal guardians)   Q: Are pregnant women at an increased risk for COVID-19?  The [employer] of Obstetricians and Gynecologists (ACOG) is monitoring closely the coronavirus pandemic. With the limited information available, data does not indicate pregnant women are at an increased risk. However, pregnant women are known to be at greater risk for respiratory infections like flu. With that in mind, expectant mothers are considered an at-risk population for COVID-19, according to ACOG.   Q: Are newborns at an increased risk for COVID-19?  A limited sample of COVID-19 data with newborns indicates the virus is not transferred to the infant during pregnancy. However, postpartum separation is recommended by the Centers for  Disease Control (CDC). As a result [place] recommends and strongly encourages temporary separation of moms and babies who test positive for COVID-19 or are awaiting results to rule out COVID-19 based on CDC guidelines.   Q: If you have a suspected case of COVID-19, is the NICU couplet care room an option?  No. If either patient is considered at-risk for having COVID-19, the [place] [facility] will not use the NICU couplet care rooms for that family.   Q: [place] is urging that elective procedures be postponed. What is considered elective for women's and children's service line?  NOT ELECTIVE: Obstetric procedures, even those with an element of choice on timing, are not considered elective. Circumcisions are considered elective procedures, however, these do not deplete blood products and other resources, which is the spirit in which the COVID-19 postponement of elective procedures was intended. Therefore, circumcisions will be allowed.   ELECTIVE: Postpartum tubal ligations are considered elective and should be postponed. Q&A for Obstetricians, Gynecologists and Pediatricians  Published March 27, 2018   [facility] supports as much as possible the medical care  care team working with the patient's individual needs to address timing during these unprecedented times. We seek the support of our medical care team in preserving needed resources throughout our crisis response to COVID-19.   Q: How does COVID-19 impact breastfeeding?  Breastmilk is safe for your baby - even if the mother has tested positive for COVID-19. If a COVID-19+ mother decides to breastfeed while inpatient and after discharge, we suggest proper protective equipment be worn and hand hygiene be performed before and after feeding the infant. The new mother also has the option to pump her milk and have a healthy family member feed the baby to protect the baby from getting the virus.   Q: Should we urge  patients to avoid baby showers and large gatherings?  Yes. As has been recommended for all citizens in our communities, gatherings of 10 or more should be avoided - pregnant or not. Seek creative options for "hosting" baby showers through electronic means that honor the request for social distancing during this time of heightened awareness.   Q: Should patients miss their prenatal appointments?  No. Prenatal visits are NOT elective. While we want to limit contact and exposure, prenatal care is vital right now. Contact your physician's office if you have concerns about your visits. We are limiting outpatient office visits to the patient and one guest in order to reduce the potential for exposure.   Q: What if a pregnant woman feels sick? Should she miss her prenatal visit then?  A pregnant woman experiencing coronavirus-like symptoms (i.e., cough, fever, difficulty breathing, shortness of breath, gastrointestinal issues) should contact her pregnancy care provider by phone. Her medical professional can best determine whether she should use a video visit or possibly go to a collection site to be tested for COVID-19. Contacting her primary care provider or her pregnancy care provider is her first step.   Q: What can I do about childbirth education? All the classes are cancelled.  The [facility] [place] will offer online learning to support mothers on their journey. We currently offer Understanding Childbirth, Understanding Breastfeeding and Understanding Newborn Care as an online class. Please visit our website, [email or web address], to register for an online class.   Q: How can I keep from getting COVID-19? Q&A for Obstetricians, Gynecologists and Pediatricians  Published March 27, 2018   Together, we can reduce the risk of exposure to the virus and help you and your family remain healthy and safe. One of the best ways to protect yourself is to wash your hands frequently using soap and  water. Also, you should avoid touching your eyes, nose and mouth with unwashed hands, avoid physical contact with others and practice social distancing.   Q: How are employees being informed about what to do?  [facility] leaders receive a daily COVID-19 update and share relevant information with their teams. This is a time when health care professionals are called on to lead within our community. We appreciate our staff's engagement with our COVID-19 updates and encourage them to share best practices on reducing the spread of the virus with our patients and community. We are prepared to provide the exceptional COVID-19 care and coordination our community needs, expects and deserves.   Q: Who's in charge of this issue at [facility]?  The leadership structure and process established to address COVID-19 includes Chief Physician Executive Phoebe Sharps, MD; Infection Prevention Medical Director Carlyle Basques, MD; and Infection Prevention Interim Director Hubert Azure, MSN, RN, CIC, CSPDT. A  of [place] experts reflecting a broad spectrum of our workforce is meeting daily to evaluate new information we receive about COVID-19 and to adapt policies and practices accordingly.                         Published March 27, 2018    Iron-Rich Diet  Iron is a mineral that helps your body to produce hemoglobin. Hemoglobin is a protein in red blood cells that carries oxygen to your body's tissues. Eating too little iron may cause you to feel weak and tired, and it can increase your risk of infection. Iron is naturally found in many foods, and many foods have iron added to them (iron-fortified foods). You may need to follow an iron-rich diet if you do not have enough iron in your body due to certain medical conditions. The amount of iron that you need each day depends on your age, your sex, and any medical conditions you have. Follow instructions from your health care provider or a diet and nutrition specialist  (dietitian) about how much iron you should eat each day. What are tips for following this plan? Reading food labels  Check food labels to see how many milligrams (mg) of iron are in each serving. Cooking  Cook foods in pots and pans that are made from iron.  Take these steps to make it easier for your body to absorb iron from certain foods: ? Soak beans overnight before cooking. ? Soak whole grains overnight and drain them before using. ? Ferment flours before baking, such as by using yeast in bread dough. Meal planning  When you eat foods that contain iron, you should eat them with foods that are high in vitamin C. These include oranges, peppers, tomatoes, potatoes, and mango. Vitamin C helps your body to absorb iron. General information  Take iron supplements only as told by your health care provider. An overdose of iron can be life-threatening. If you were prescribed iron supplements, take them with orange juice or a vitamin C supplement.  When you eat iron-fortified foods or take an iron supplement, you should also eat foods that naturally contain iron, such as meat, poultry, and fish. Eating naturally iron-rich foods helps your body to absorb the iron that is added to other foods or contained in a supplement.  Certain foods and drinks prevent your body from absorbing iron properly. Avoid eating these foods in the same meal as iron-rich foods or with iron supplements. These foods include: ? Coffee, black tea, and red wine. ? Milk, dairy products, and foods that are high in calcium. ? Beans and soybeans. ? Whole grains. What foods should I eat? Fruits Prunes. Raisins. Eat fruits high in vitamin C, such as oranges, grapefruits, and strawberries, alongside iron-rich foods. Vegetables Spinach (cooked). Green peas. Broccoli. Fermented vegetables. Eat vegetables high in vitamin C, such as leafy greens, potatoes, bell peppers, and tomatoes, alongside iron-rich  foods. Grains Iron-fortified breakfast cereal. Iron-fortified whole-wheat bread. Enriched rice. Sprouted grains. Meats and other proteins Beef liver. Oysters. Beef. Shrimp. [place]. Chicken. Tuna. Sardines. Chickpeas. Nuts. Tofu. Pumpkin seeds. Beverages Tomato juice. Fresh orange juice. Prune juice. Hibiscus tea. Fortified instant breakfast shakes. Sweets and desserts Blackstrap molasses. Seasonings and condiments Tahini. Fermented soy sauce. Other foods Wheat germ. The items listed above may not be a complete list of recommended foods and beverages. Contact a dietitian for more information. What foods should I avoid? Grains Whole grains. Bran cereal. Bran flour. Oats.  Meats and other proteins Soybeans. Products made from soy protein. Black beans. Lentils. Mung beans. Split peas. Dairy Milk. Cream. Cheese. Yogurt. Cottage cheese. Beverages Coffee. Black tea. Red wine. Sweets and desserts Cocoa. Chocolate. Ice cream. Other foods Basil. Oregano. Large amounts of parsley. The items listed above may not be a complete list of foods and beverages to avoid. Contact a dietitian for more information. Summary  Iron is a mineral that helps your body to produce hemoglobin. Hemoglobin is a protein in red blood cells that carries oxygen to your body's tissues.  Iron is naturally found in many foods, and many foods have iron added to them (iron-fortified foods).  When you eat foods that contain iron, you should eat them with foods that are high in vitamin C. Vitamin C helps your body to absorb iron.  Certain foods and drinks prevent your body from absorbing iron properly, such as whole grains and dairy products. You should avoid eating these foods in the same meal as iron-rich foods or with iron supplements. This information is not intended to replace advice given to you by your health care provider. Make sure you discuss any questions you have with your health care provider. Document  Released: 08/07/2004 Document Revised: 11/19/2016 Document Reviewed: 11/19/2016 Elsevier Interactive Patient Education  2019 [employer]. Levonorgestrel intrauterine device (IUD) What is this medicine? LEVONORGESTREL IUD (LEE voe nor jes trel) is a contraceptive (birth control) device. The device is placed inside the uterus by a healthcare professional. It is used to prevent pregnancy. This device can also be used to treat heavy bleeding that occurs during your period. This medicine may be used for other purposes; ask your health care provider or pharmacist if you have questions. COMMON BRAND NAME(S): Cameron Ali What should I tell my health care provider before I take this medicine? They need to know if you have any of these conditions: -abnormal Pap smear -cancer of the breast, uterus, or cervix -diabetes -endometritis -genital or pelvic infection now or in the past -have more than one sexual partner or your partner has more than one partner -heart disease -history of an ectopic or tubal pregnancy -immune system problems -IUD in place -liver disease or tumor -problems with blood clots or take blood-thinners -seizures -use intravenous drugs -uterus of unusual shape -vaginal bleeding that has not been explained -an unusual or allergic reaction to levonorgestrel, other hormones, silicone, or polyethylene, medicines, foods, dyes, or preservatives -pregnant or trying to get pregnant -breast-feeding How should I use this medicine? This device is placed inside the uterus by a health care professional. Talk to your pediatrician regarding the use of this medicine in children. Special care may be needed. Overdosage: If you think you have taken too much of this medicine contact a poison control center or emergency room at once. NOTE: This medicine is only for you. Do not share this medicine with others. What if I miss a dose? This does not apply. Depending on the brand  of device you have inserted, the device will need to be replaced every 3 to 5 years if you wish to continue using this type of birth control. What may interact with this medicine? Do not take this medicine with any of the following medications: -amprenavir -bosentan -fosamprenavir This medicine may also interact with the following medications: -aprepitant -armodafinil -barbiturate medicines for inducing sleep or treating seizures -bexarotene -boceprevir -griseofulvin -medicines to treat seizures like carbamazepine, ethotoin, felbamate, oxcarbazepine, phenytoin, topiramate -modafinil -pioglitazone -rifabutin -rifampin -rifapentine -some  medicines to treat HIV infection like atazanavir, efavirenz, indinavir, lopinavir, nelfinavir, tipranavir, ritonavir -St. John's wort -warfarin This list may not describe all possible interactions. Give your health care provider a list of all the medicines, herbs, non-prescription drugs, or dietary supplements you use. Also tell them if you smoke, drink alcohol, or use illegal drugs. Some items may interact with your medicine. What should I watch for while using this medicine? Visit your doctor or health care professional for regular check ups. See your doctor if you or your partner has sexual contact with others, becomes HIV positive, or gets a sexual transmitted disease. This product does not protect you against HIV infection (AIDS) or other sexually transmitted diseases. You can check the placement of the IUD yourself by reaching up to the top of your vagina with clean fingers to feel the threads. Do not pull on the threads. It is a good habit to check placement after each menstrual period. Call your doctor right away if you feel more of the IUD than just the threads or if you cannot feel the threads at all. The IUD may come out by itself. You may become pregnant if the device comes out. If you notice that the IUD has come out use a backup birth control  method like condoms and call your health care provider. Using tampons will not change the position of the IUD and are okay to use during your period. This IUD can be safely scanned with magnetic resonance imaging (MRI) only under specific conditions. Before you have an MRI, tell your healthcare provider that you have an IUD in place, and which type of IUD you have in place. What side effects may I notice from receiving this medicine? Side effects that you should report to your doctor or health care professional as soon as possible: -allergic reactions like skin rash, itching or hives, swelling of the face, lips, or tongue -fever, flu-like symptoms -genital sores -high blood pressure -no menstrual period for 6 weeks during use -pain, swelling, warmth in the leg -pelvic pain or tenderness -severe or sudden headache -signs of pregnancy -stomach cramping -sudden shortness of breath -trouble with balance, talking, or walking -unusual vaginal bleeding, discharge -yellowing of the eyes or skin Side effects that usually do not require medical attention (report to your doctor or health care professional if they continue or are bothersome): -acne -breast pain -change in sex drive or performance -changes in weight -cramping, dizziness, or faintness while the device is being inserted -headache -irregular menstrual bleeding within first 3 to 6 months of use -nausea This list may not describe all possible side effects. Call your doctor for medical advice about side effects. You may report side effects to FDA at 1-800-FDA-1088. Where should I keep my medicine? This does not apply. NOTE: This sheet is a summary. It may not cover all possible information. If you have questions about this medicine, talk to your doctor, pharmacist, or health care provider.  2019 Elsevier/Gold Standard (2015-10-06 14:14:56)

## 2018-05-06 LAB — CBC
Hematocrit: 34.9 % (ref 34.0–46.6)
Hemoglobin: 11.8 g/dL (ref 11.1–15.9)
MCH: 29.5 pg (ref 26.6–33.0)
MCHC: 33.8 g/dL (ref 31.5–35.7)
MCV: 87 fL (ref 79–97)
Platelets: 359 10*3/uL (ref 150–450)
RBC: 4 x10E6/uL (ref 3.77–5.28)
RDW: 13.5 % (ref 11.7–15.4)
WBC: 12.9 10*3/uL — ABNORMAL HIGH (ref 3.4–10.8)

## 2018-05-06 LAB — B12 AND FOLATE PANEL
Folate: >20 ng/mL (ref >3.0)
Vitamin B-12: 334 pg/mL (ref 232–1245)

## 2018-05-06 LAB — FERRITIN: Ferritin: 15 ng/mL (ref 15–150)

## 2018-05-07 LAB — STREP GP B NAA: Strep Gp B NAA: POSITIVE — AB

## 2018-05-08 LAB — GC/CHLAMYDIA PROBE AMP
Chlamydia trachomatis, NAA: NEGATIVE
Neisseria Gonorrhoeae by PCR: NEGATIVE

## 2018-05-18 ENCOUNTER — Observation Stay
Admission: EM | Admit: 2018-05-18 | Discharge: 2018-05-18 | Disposition: A | Discharge status: Home / Self Care | Payer: 59 | Attending: Obstetrics and Gynecology | Admitting: Obstetrics and Gynecology

## 2018-05-18 ENCOUNTER — Other Ambulatory Visit: Payer: Self-pay

## 2018-05-18 DIAGNOSIS — R030 Elevated blood-pressure reading, without diagnosis of hypertension: Secondary | ICD-10-CM | POA: Diagnosis not present

## 2018-05-18 DIAGNOSIS — Z3A37 37 weeks gestation of pregnancy: Secondary | ICD-10-CM | POA: Insufficient documentation

## 2018-05-18 DIAGNOSIS — Z3A38 38 weeks gestation of pregnancy: Secondary | ICD-10-CM | POA: Diagnosis not present

## 2018-05-18 DIAGNOSIS — O26893 Other specified pregnancy related conditions, third trimester: Secondary | ICD-10-CM | POA: Diagnosis not present

## 2018-05-18 DIAGNOSIS — O163 Unspecified maternal hypertension, third trimester: Secondary | ICD-10-CM

## 2018-05-18 DIAGNOSIS — M7989 Other specified soft tissue disorders: Secondary | ICD-10-CM | POA: Diagnosis present

## 2018-05-18 LAB — URINALYSIS, ROUTINE W REFLEX MICROSCOPIC
Bilirubin Urine: NEGATIVE
Glucose, UA: NEGATIVE mg/dL
Hgb urine dipstick: NEGATIVE
Ketones, ur: NEGATIVE mg/dL
Leukocytes,Ua: NEGATIVE
Nitrite: NEGATIVE
Protein, ur: NEGATIVE mg/dL
Specific Gravity, Urine: 1.014 (ref 1.005–1.030)
pH: 7 (ref 5.0–8.0)

## 2018-05-18 MED ORDER — ACETAMINOPHEN 325 MG PO TABS: 650.0000 mg | ORAL_TABLET | Freq: Four times a day (QID) | ORAL | Status: DC | PRN

## 2018-05-18 NOTE — OB Triage Note (Signed)
Pt arrived to unit with c/o elevated bp 140/90 at work and increased swelling in hands and feet. Pt reports mild HA. Has not taken any tylenol. Pt reports +FM. Denies visual disturbance and abdominal pain. Denies LOF or vaginal bleeding.  Will continue to assess.

## 2018-05-18 NOTE — Discharge Instructions (Signed)
Hypertension During Pregnancy  Hypertension, commonly called high blood pressure, is when the force of blood pumping through your arteries is too strong. Arteries are blood vessels that carry blood from the heart throughout the body. Hypertension during pregnancy can cause problems for you and your baby. Your baby may be born early (prematurely) or may not weigh as much as he or she should at birth. Very bad cases of hypertension during pregnancy can be life-threatening. Different types of hypertension can occur during pregnancy. These include:  Chronic hypertension. This happens when: ? You have hypertension before pregnancy and it continues during pregnancy. ? You develop hypertension before you are [redacted] weeks pregnant, and it continues during pregnancy.  Gestational hypertension. This is hypertension that develops after the 20th week of pregnancy.  Preeclampsia, also called toxemia of pregnancy. This is a very serious type of hypertension that develops during pregnancy. It can be very dangerous for you and your baby. ? In rare cases, you may develop preeclampsia after giving birth (postpartum preeclampsia). This usually occurs within 48 hours after childbirth but may occur up to 6 weeks after giving birth. Gestational hypertension and preeclampsia usually go away within 6 weeks after your baby is born. Women who have hypertension during pregnancy have a greater chance of developing hypertension later in life or during future pregnancies. What are the causes? The exact cause of hypertension during pregnancy is not known. What increases the risk? There are certain factors that make it more likely for you to develop hypertension during pregnancy. These include:  Having hypertension during a previous pregnancy or prior to pregnancy.  Being overweight.  Being age 35 or older.  Being pregnant for the first time.  Being pregnant with more than one baby.  Becoming pregnant using fertilization  methods such as IVF (in vitro fertilization).  Having diabetes, kidney problems, or systemic lupus erythematosus.  Having a family history of hypertension. What are the signs or symptoms? Chronic hypertension and gestational hypertension rarely cause symptoms. Preeclampsia causes symptoms, which may include:  Increased protein in your urine. Your health care provider will check for this at every visit before you give birth (prenatal visit).  Severe headaches.  Sudden weight gain.  Swelling of the hands, face, legs, and feet.  Nausea and vomiting.  Vision problems, such as blurred or double vision.  Numbness in the face, arms, legs, and feet.  Dizziness.  Slurred speech.  Sensitivity to bright lights.  Abdominal pain.  Convulsions or seizures. How is this diagnosed? You may be diagnosed with hypertension during a routine prenatal exam. At each prenatal visit, you may:  Have a urine test to check for high amounts of protein in your urine.  Have your blood pressure checked. A blood pressure reading is given as two numbers, such as "120 over 80" (or 120/80). The first ("top") number is a measure of the pressure in your arteries when your heart beats (systolic pressure). The second ("bottom") number is a measure of the pressure in your arteries as your heart relaxes between beats (diastolic pressure). Blood pressure is measured in a unit called mm Hg. For most women, a normal blood pressure reading is: ? Systolic: below 120. ? Diastolic: below 80. The type of hypertension that you are diagnosed with depends on your test results and when your symptoms developed.  Chronic hypertension is usually diagnosed before 20 weeks of pregnancy.  Gestational hypertension is usually diagnosed after 20 weeks of pregnancy.  Hypertension with high amounts of protein in   the urine is diagnosed as preeclampsia.  Blood pressure measurements that stay above 160 systolic, or above 110 diastolic,  are signs of severe preeclampsia. How is this treated? Treatment for hypertension during pregnancy varies depending on the type of hypertension you have and how serious it is.  If you take medicines called ACE inhibitors to treat chronic hypertension, you may need to switch medicines. ACE inhibitors should not be taken during pregnancy.  If you have gestational hypertension, you may need to take blood pressure medicine.  If you are at risk for preeclampsia, your health care provider may recommend that you take a low-dose aspirin during your pregnancy.  If you have severe preeclampsia, you may need to be hospitalized so you and your baby can be monitored closely. You may also need to take medicine (magnesium sulfate) to prevent seizures and to lower blood pressure. This medicine may be given as an injection or through an IV.  In some cases, if your condition gets worse, you may need to deliver your baby early. Follow these instructions at home: Eating and drinking   Drink enough fluid to keep your urine pale yellow.  Avoid caffeine. Lifestyle  Do not use any products that contain nicotine or tobacco, such as cigarettes and e-cigarettes. If you need help quitting, ask your health care provider.  Do not use alcohol or drugs.  Avoid stress as much as possible. Rest and get plenty of sleep. General instructions  Take over-the-counter and prescription medicines only as told by your health care provider.  While lying down, lie on your left side. This keeps pressure off your major blood vessels.  While sitting or lying down, raise (elevate) your feet. Try putting some pillows under your lower legs.  Exercise regularly. Ask your health care provider what kinds of exercise are best for you.  Keep all prenatal and follow-up visits as told by your health care provider. This is important. Contact a health care provider if:  You have symptoms that your health care provider told you may  require more treatment or monitoring, such as: ? Nausea or vomiting. ? Headache. Get help right away if you have:  Severe abdominal pain that does not get better with treatment.  A severe headache that does not get better.  Vomiting that does not get better.  Sudden, rapid weight gain.  Sudden swelling in your hands, ankles, or face.  Vaginal bleeding.  Blood in your urine.  Fewer movements from your baby than usual.  Blurred or double vision.  Muscle twitching or sudden muscle tightening (spasms).  Shortness of breath.  Blue fingernails or lips. Summary  Hypertension, commonly called high blood pressure, is when the force of blood pumping through your arteries is too strong.  Hypertension during pregnancy can cause problems for you and your baby.  Treatment for hypertension during pregnancy varies depending on the type of hypertension you have and how serious it is.  Get help right away if you have symptoms that your health care provider told you to watch for. This information is not intended to replace advice given to you by your health care provider. Make sure you discuss any questions you have with your health care provider. Document Released: 09/11/2010 Document Revised: 12/10/2016 Document Reviewed: 06/09/2015 Elsevier Interactive Patient Education  2019 [employer] Preeclampsia and Eclampsia  Preeclampsia is a serious condition that may develop during pregnancy. It is also called toxemia of pregnancy. This condition causes high blood pressure along with other symptoms, such as   swelling and headaches. These symptoms may develop as the condition gets worse. Preeclampsia may occur at 20 weeks of pregnancy or later. Diagnosing and treating preeclampsia early is very important. If not treated early, it can cause serious problems for you and your baby. One problem it can lead to is eclampsia. Eclampsia is a condition that causes muscle jerking or shaking (convulsions or  seizures) and other serious problems for the mother. During pregnancy, delivering your baby may be the best treatment for preeclampsia or eclampsia. For most women, preeclampsia and eclampsia symptoms go away after giving birth. In rare cases, a woman may develop preeclampsia after giving birth (postpartum preeclampsia). This usually occurs within 48 hours after childbirth but may occur up to 6 weeks after giving birth. What are the causes? The cause of preeclampsia is not known. What increases the risk? The following risk factors make you more likely to develop preeclampsia:  Being pregnant for the first time.  Having had preeclampsia during a past pregnancy.  Having a family history of preeclampsia.  Having high blood pressure.  Being pregnant with more than one baby.  Being 35 or older.  Being African-American.  Having kidney disease or diabetes.  Having medical conditions such as lupus or blood diseases.  Being very overweight (obese). What are the signs or symptoms? The earliest signs of preeclampsia are:  High blood pressure.  Increased protein in your urine. Your health care provider will check for this at every visit before you give birth (prenatal visit). Other symptoms that may develop as the condition gets worse include:  Severe headaches.  Sudden weight gain.  Swelling of the hands, face, legs, and feet.  Nausea and vomiting.  Vision problems, such as blurred or double vision.  Numbness in the face, arms, legs, and feet.  Urinating less than usual.  Dizziness.  Slurred speech.  Abdominal pain, especially upper abdominal pain.  Convulsions or seizures. How is this diagnosed? There are no screening tests for preeclampsia. Your health care provider will ask you about symptoms and check for signs of preeclampsia during your prenatal visits. You may also have tests that include:  Urine tests.  Blood tests.  Checking your blood  pressure.  Monitoring your baby's heart rate.  Ultrasound. How is this treated? You and your health care provider will determine the treatment approach that is best for you. Treatment may include:  Having more frequent prenatal exams to check for signs of preeclampsia, if you have an increased risk for preeclampsia.  Medicine to lower your blood pressure.  Staying in the hospital, if your condition is severe. There, treatment will focus on controlling your blood pressure and the amount of fluids in your body (fluid retention).  Taking medicine (magnesium sulfate) to prevent seizures. This may be given as an injection or through an IV.  Taking a low-dose aspirin during your pregnancy.  Delivering your baby early, if your condition gets worse. You may have your labor started with medicine (induced), or you may have a cesarean delivery. Follow these instructions at home: Eating and drinking   Drink enough fluid to keep your urine pale yellow.  Avoid caffeine. Lifestyle  Do not use any products that contain nicotine or tobacco, such as cigarettes and e-cigarettes. If you need help quitting, ask your health care provider.  Do not use alcohol or drugs.  Avoid stress as much as possible. Rest and get plenty of sleep. General instructions  Take over-the-counter and prescription medicines only as told by your   health care provider.  When lying down, lie on your left side. This keeps pressure off your major blood vessels.  When sitting or lying down, raise (elevate) your feet. Try putting some pillows underneath your lower legs.  Exercise regularly. Ask your health care provider what kinds of exercise are best for you.  Keep all follow-up and prenatal visits as told by your health care provider. This is important. How is this prevented? There is no known way of preventing preeclampsia or eclampsia from developing. However, to lower your risk of complications and detect problems  early:  Get regular prenatal care. Your health care provider may be able to diagnose and treat the condition early.  Maintain a healthy weight. Ask your health care provider for help managing weight gain during pregnancy.  Work with your health care provider to manage any long-term (chronic) health conditions you have, such as diabetes or kidney problems.  You may have tests of your blood pressure and kidney function after giving birth.  Your health care provider may have you take low-dose aspirin during your next pregnancy. Contact a health care provider if:  You have symptoms that your health care provider told you may require more treatment or monitoring, such as: ? Headaches. ? Nausea or vomiting. ? Abdominal pain. ? Dizziness. ? Light-headedness. Get help right away if:  You have severe: ? Abdominal pain. ? Headaches that do not get better. ? Dizziness. ? Vision problems. ? Confusion. ? Nausea or vomiting.  You have any of the following: ? A seizure. ? Sudden, rapid weight gain. ? Sudden swelling in your hands, ankles, or face. ? Trouble moving any part of your body. ? Numbness in any part of your body. ? Trouble speaking. ? Abnormal bleeding.  You faint. Summary  Preeclampsia is a serious condition that may develop during pregnancy. It is also called toxemia of pregnancy.  This condition causes high blood pressure along with other symptoms, such as swelling and headaches.  Diagnosing and treating preeclampsia early is very important. If not treated early, it can cause serious problems for you and your baby.  Get help right away if you have symptoms that your health care provider told you to watch for. This information is not intended to replace advice given to you by your health care provider. Make sure you discuss any questions you have with your health care provider. Document Released: 12/22/1999 Document Revised: 12/10/2016 Document Reviewed:  07/31/2015 Elsevier Interactive Patient Education  2019 [employer]  

## 2018-05-18 NOTE — OB Triage Provider Note (Signed)
L&D OB Triage Note  Sarah Blankenship is a 23 y.o. G1P0000 female at [redacted]w[redacted]d, EDD Estimated Date of Delivery: 06/04/18 who presented to triage for complaints of elevated blood pressure at work and slight swelling. States BP at work was 140/90.  She was evaluated by the myself with no significant findings/findings significant for pre-eclampsia in pregnancy. Vital signs stable. An NST was performed and has been reviewed by me. She was treated with po tylenol and fluids.   NST INTERPRETATION: Indications: patient reassurance                   Impression: reactive   Plan: NST performed was reviewed and was found to be reactive. She was discharged home with bleeding/labor precautions.  Continue routine prenatal care. Follow up with OB/GYN as previously scheduled.     Melody Suzan Nailer, CNM

## 2018-05-19 ENCOUNTER — Ambulatory Visit: Payer: 59 | Admitting: Certified Nurse Midwife

## 2018-05-19 VITALS — BP 145/95 | HR 98 | Wt 176.0 lb

## 2018-05-19 DIAGNOSIS — R030 Elevated blood-pressure reading, without diagnosis of hypertension: Secondary | ICD-10-CM | POA: Diagnosis not present

## 2018-05-19 DIAGNOSIS — O9989 Other specified diseases and conditions complicating pregnancy, childbirth and the puerperium: Secondary | ICD-10-CM

## 2018-05-19 DIAGNOSIS — Z3493 Encounter for supervision of normal pregnancy, unspecified, third trimester: Secondary | ICD-10-CM | POA: Diagnosis not present

## 2018-05-19 LAB — COMPREHENSIVE METABOLIC PANEL
ALT: 10 IU/L (ref 0–32)
AST: 12 IU/L (ref 0–40)
Albumin/Globulin Ratio: 1.2 (ref 1.2–2.2)
Albumin: 3.3 g/dL — ABNORMAL LOW (ref 3.9–5.0)
Alkaline Phosphatase: 107 IU/L (ref 39–117)
BUN/Creatinine Ratio: 12 (ref 9–23)
BUN: 7 mg/dL (ref 6–20)
Bilirubin Total: <0.2 mg/dL (ref 0.0–1.2)
CO2: 19 mmol/L — ABNORMAL LOW (ref 20–29)
Calcium: 9.7 mg/dL (ref 8.7–10.2)
Chloride: 102 mmol/L (ref 96–106)
Creatinine, Ser: 0.6 mg/dL (ref 0.57–1.00)
GFR calc Af Amer: 147 mL/min/{1.73_m2} (ref >59)
GFR calc non Af Amer: 127 mL/min/{1.73_m2} (ref >59)
Globulin, Total: 2.8 g/dL (ref 1.5–4.5)
Glucose: 94 mg/dL (ref 65–99)
Potassium: 4.2 mmol/L (ref 3.5–5.2)
Sodium: 136 mmol/L (ref 134–144)
Total Protein: 6.1 g/dL (ref 6.0–8.5)

## 2018-05-19 LAB — CBC
Hematocrit: 34 % (ref 34.0–46.6)
Hemoglobin: 11.6 g/dL (ref 11.1–15.9)
MCH: 30.3 pg (ref 26.6–33.0)
MCHC: 34.1 g/dL (ref 31.5–35.7)
MCV: 89 fL (ref 79–97)
Platelets: 339 10*3/uL (ref 150–450)
RBC: 3.83 x10E6/uL (ref 3.77–5.28)
RDW: 13.6 % (ref 11.7–15.4)
WBC: 10.3 10*3/uL (ref 3.4–10.8)

## 2018-05-19 LAB — POCT URINALYSIS DIPSTICK OB
Bilirubin, UA: NEGATIVE
Blood, UA: NEGATIVE
Glucose, UA: NEGATIVE
Ketones, UA: NEGATIVE
Nitrite, UA: NEGATIVE
POC,PROTEIN,UA: NEGATIVE
Spec Grav, UA: 1.01 (ref 1.010–1.025)
Urobilinogen, UA: 0.2 E.U./dL
pH, UA: 6.5 (ref 5.0–8.0)

## 2018-05-19 LAB — PROTEIN / CREATININE RATIO, URINE
Creatinine, Urine: 54 mg/dL
Protein, Ur: 12.1 mg/dL
Protein/Creat Ratio: 224 mg/g creat — ABNORMAL HIGH (ref 0–200)

## 2018-05-19 NOTE — Progress Notes (Signed)
ROB-Reports elevated blood pressure with proteinuria at work yesterday. Seen in triage last night with normal findings. Elevated blood pressure today with lingering headache. No blurred vision or epigastric pain. Reflexes normal. PIH labs today, see orders; will contact patient with results. Anticipatory guidance regarding course of prenatal care. Reviewed red flag symptoms and when to call. RTC x Friday for blood pressure check and repeat labs or sooner if needed.    POC Urinalysis Dipstick OB     Status: Abnormal   Collection Time: 05/19/18  9:39 AM  Result Value Ref Range   Color, UA Yellow    Clarity, UA Clear    Glucose, UA Negative Negative   Bilirubin, UA Negative    Ketones, UA Negative    Spec Grav, UA 1.010 1.010 - 1.025   Blood, UA Negative    pH, UA 6.5 5.0 - 8.0   POC,PROTEIN,UA Negative Negative, Trace, Small (1+), Moderate (2+), Large (3+), 4+   Urobilinogen, UA 0.2 0.2 or 1.0 E.U./dL   Nitrite, UA Negative    Leukocytes, UA Trace (A) Negative   Appearance     Odor    CBC     Status: None   Collection Time: 05/19/18 10:06 AM  Result Value Ref Range   WBC 10.3 3.4 - 10.8 x10E3/uL   RBC 3.83 3.77 - 5.28 x10E6/uL   Hemoglobin 11.6 11.1 - 15.9 g/dL   Hematocrit 17.9 15.0 - 46.6 %   MCV 89 79 - 97 fL   MCH 30.3 26.6 - 33.0 pg   MCHC 34.1 31.5 - 35.7 g/dL   RDW 56.9 79.4 - 80.1 %   Platelets 339 150 - 450 x10E3/uL  Comprehensive metabolic panel     Status: Abnormal   Collection Time: 05/19/18 10:06 AM  Result Value Ref Range   Glucose 94 65 - 99 mg/dL   BUN 7 6 - 20 mg/dL   Creatinine, Ser 6.55 0.57 - 1.00 mg/dL   GFR calc non Af Amer 127 >59 mL/min/1.73   GFR calc Af Amer 147 >59 mL/min/1.73   BUN/Creatinine Ratio 12 9 - 23   Sodium 136 134 - 144 mmol/L   Potassium 4.2 3.5 - 5.2 mmol/L   Chloride 102 96 - 106 mmol/L   CO2 19 (L) 20 - 29 mmol/L   Calcium 9.7 8.7 - 10.2 mg/dL   Total Protein 6.1 6.0 - 8.5 g/dL   Albumin 3.3 (L) 3.9 - 5.0 g/dL   Globulin, Total  2.8 1.5 - 4.5 g/dL   Albumin/Globulin Ratio 1.2 1.2 - 2.2   Bilirubin Total <0.2 0.0 - 1.2 mg/dL   Alkaline Phosphatase 107 39 - 117 IU/L   AST 12 0 - 40 IU/L   ALT 10 0 - 32 IU/L  Protein / creatinine ratio, urine     Status: Abnormal   Collection Time: 05/19/18 10:06 AM  Result Value Ref Range   Creatinine, Urine 54.0 Not Estab. mg/dL   Protein, Ur 37.4 Not Estab. mg/dL   Protein/Creat Ratio 827 (H) 0 - 200 mg/g creat

## 2018-05-19 NOTE — Patient Instructions (Addendum)
Preeclampsia and Eclampsia  Preeclampsia is a serious condition that may develop during pregnancy. It is also called toxemia of pregnancy. This condition causes high blood pressure along with other symptoms, such as swelling and headaches. These symptoms may develop as the condition gets worse. Preeclampsia may occur at 20 weeks of pregnancy or later. Diagnosing and treating preeclampsia early is very important. If not treated early, it can cause serious problems for you and your baby. One problem it can lead to is eclampsia. Eclampsia is a condition that causes muscle jerking or shaking (convulsions or seizures) and other serious problems for the mother. During pregnancy, delivering your baby may be the best treatment for preeclampsia or eclampsia. For most women, preeclampsia and eclampsia symptoms go away after giving birth. In rare cases, a woman may develop preeclampsia after giving birth (postpartum preeclampsia). This usually occurs within 48 hours after childbirth but may occur up to 6 weeks after giving birth. What are the causes? The cause of preeclampsia is not known. What increases the risk? The following risk factors make you more likely to develop preeclampsia:  Being pregnant for the first time.  Having had preeclampsia during a past pregnancy.  Having a family history of preeclampsia.  Having high blood pressure.  Being pregnant with more than one baby.  Being 35 or older.  Being African-American.  Having kidney disease or diabetes.  Having medical conditions such as lupus or blood diseases.  Being very overweight (obese). What are the signs or symptoms? The earliest signs of preeclampsia are:  High blood pressure.  Increased protein in your urine. Your health care provider will check for this at every visit before you give birth (prenatal visit). Other symptoms that may develop as the condition gets worse include:  Severe headaches.  Sudden weight gain.   Swelling of the hands, face, legs, and feet.  Nausea and vomiting.  Vision problems, such as blurred or double vision.  Numbness in the face, arms, legs, and feet.  Urinating less than usual.  Dizziness.  Slurred speech.  Abdominal pain, especially upper abdominal pain.  Convulsions or seizures. How is this diagnosed? There are no screening tests for preeclampsia. Your health care provider will ask you about symptoms and check for signs of preeclampsia during your prenatal visits. You may also have tests that include:  Urine tests.  Blood tests.  Checking your blood pressure.  Monitoring your baby's heart rate.  Ultrasound. How is this treated? You and your health care provider will determine the treatment approach that is best for you. Treatment may include:  Having more frequent prenatal exams to check for signs of preeclampsia, if you have an increased risk for preeclampsia.  Medicine to lower your blood pressure.  Staying in the hospital, if your condition is severe. There, treatment will focus on controlling your blood pressure and the amount of fluids in your body (fluid retention).  Taking medicine (magnesium sulfate) to prevent seizures. This may be given as an injection or through an IV.  Taking a low-dose aspirin during your pregnancy.  Delivering your baby early, if your condition gets worse. You may have your labor started with medicine (induced), or you may have a cesarean delivery. Follow these instructions at home: Eating and drinking   Drink enough fluid to keep your urine pale yellow.  Avoid caffeine. Lifestyle  Do not use any products that contain nicotine or tobacco, such as cigarettes and e-cigarettes. If you need help quitting, ask your health care provider.    Do not use alcohol or drugs.  Avoid stress as much as possible. Rest and get plenty of sleep. General instructions  Take over-the-counter and prescription medicines only as told by  your health care provider.  When lying down, lie on your left side. This keeps pressure off your major blood vessels.  When sitting or lying down, raise (elevate) your feet. Try putting some pillows underneath your lower legs.  Exercise regularly. Ask your health care provider what kinds of exercise are best for you.  Keep all follow-up and prenatal visits as told by your health care provider. This is important. How is this prevented? There is no known way of preventing preeclampsia or eclampsia from developing. However, to lower your risk of complications and detect problems early:  Get regular prenatal care. Your health care provider may be able to diagnose and treat the condition early.  Maintain a healthy weight. Ask your health care provider for help managing weight gain during pregnancy.  Work with your health care provider to manage any long-term (chronic) health conditions you have, such as diabetes or kidney problems.  You may have tests of your blood pressure and kidney function after giving birth.  Your health care provider may have you take low-dose aspirin during your next pregnancy. Contact a health care provider if:  You have symptoms that your health care provider told you may require more treatment or monitoring, such as: ? Headaches. ? Nausea or vomiting. ? Abdominal pain. ? Dizziness. ? Light-headedness. Get help right away if:  You have severe: ? Abdominal pain. ? Headaches that do not get better. ? Dizziness. ? Vision problems. ? Confusion. ? Nausea or vomiting.  You have any of the following: ? A seizure. ? Sudden, rapid weight gain. ? Sudden swelling in your hands, ankles, or face. ? Trouble moving any part of your body. ? Numbness in any part of your body. ? Trouble speaking. ? Abnormal bleeding.  You faint. Summary  Preeclampsia is a serious condition that may develop during pregnancy. It is also called toxemia of pregnancy.  This  condition causes high blood pressure along with other symptoms, such as swelling and headaches.  Diagnosing and treating preeclampsia early is very important. If not treated early, it can cause serious problems for you and your baby.  Get help right away if you have symptoms that your health care provider told you to watch for. This information is not intended to replace advice given to you by your health care provider. Make sure you discuss any questions you have with your health care provider. Document Released: 12/22/1999 Document Revised: 12/10/2016 Document Reviewed: 07/31/2015 Elsevier Interactive Patient Education  2019 [employer] Fetal Movement Counts Patient Name: ________________________________________________ Patient Due Date: ____________________ What is a fetal movement count?  A fetal movement count is the number of times that you feel your baby move during a certain amount of time. This may also be called a fetal kick count. A fetal movement count is recommended for every pregnant woman. You may be asked to start counting fetal movements as early as week 28 of your pregnancy. Pay attention to when your baby is most active. You may notice your baby's sleep and wake cycles. You may also notice things that make your baby move more. You should do a fetal movement count:  When your baby is normally most active.  At the same time each day. A good time to count movements is while you are resting, after having something to   eat and drink. How do I count fetal movements? 1. Find a quiet, comfortable area. Sit, or lie down on your side. 2. Write down the date, the start time and stop time, and the number of movements that you felt between those two times. Take this information with you to your health care visits. 3. For 2 hours, count kicks, flutters, swishes, rolls, and jabs. You should feel at least 10 movements during 2 hours. 4. You may stop counting after you have felt 10  movements. 5. If you do not feel 10 movements in 2 hours, have something to eat and drink. Then, keep resting and counting for 1 hour. If you feel at least 4 movements during that hour, you may stop counting. Contact a health care provider if:  You feel fewer than 4 movements in 2 hours.  Your baby is not moving like he or she usually does. Date: ____________ Start time: ____________ Stop time: ____________ Movements: ____________ Date: ____________ Start time: ____________ Stop time: ____________ Movements: ____________ Date: ____________ Start time: ____________ Stop time: ____________ Movements: ____________ Date: ____________ Start time: ____________ Stop time: ____________ Movements: ____________ Date: ____________ Start time: ____________ Stop time: ____________ Movements: ____________ Date: ____________ Start time: ____________ Stop time: ____________ Movements: ____________ Date: ____________ Start time: ____________ Stop time: ____________ Movements: ____________ Date: ____________ Start time: ____________ Stop time: ____________ Movements: ____________ Date: ____________ Start time: ____________ Stop time: ____________ Movements: ____________ This information is not intended to replace advice given to you by your health care provider. Make sure you discuss any questions you have with your health care provider. Document Released: 01/23/2006 Document Revised: 08/23/2015 Document Reviewed: 02/02/2015 Elsevier Interactive Patient Education  2019 [employer] Inc[employer]  

## 2018-05-19 NOTE — Progress Notes (Signed)
ROB-Patient c/o "nagging headache", took Tylenol last night with no relief.   BP reck 134/96.

## 2018-05-22 ENCOUNTER — Encounter: Payer: Self-pay | Admitting: *Deleted

## 2018-05-22 ENCOUNTER — Other Ambulatory Visit: Payer: 59

## 2018-05-22 ENCOUNTER — Inpatient Hospital Stay
Admission: EM | Admit: 2018-05-22 | Discharge: 2018-05-22 | Disposition: A | Discharge status: Home / Self Care | Payer: 59 | Attending: Obstetrics and Gynecology | Admitting: Obstetrics and Gynecology

## 2018-05-22 ENCOUNTER — Other Ambulatory Visit: Payer: Self-pay

## 2018-05-22 ENCOUNTER — Ambulatory Visit: Payer: 59 | Admitting: Certified Nurse Midwife

## 2018-05-22 VITALS — BP 142/106 | HR 99 | Wt 179.7 lb

## 2018-05-22 DIAGNOSIS — O133 Gestational [pregnancy-induced] hypertension without significant proteinuria, third trimester: Secondary | ICD-10-CM | POA: Insufficient documentation

## 2018-05-22 DIAGNOSIS — Z3493 Encounter for supervision of normal pregnancy, unspecified, third trimester: Secondary | ICD-10-CM

## 2018-05-22 DIAGNOSIS — O26893 Other specified pregnancy related conditions, third trimester: Secondary | ICD-10-CM | POA: Diagnosis not present

## 2018-05-22 DIAGNOSIS — Z8759 Personal history of other complications of pregnancy, childbirth and the puerperium: Secondary | ICD-10-CM | POA: Diagnosis present

## 2018-05-22 DIAGNOSIS — R030 Elevated blood-pressure reading, without diagnosis of hypertension: Secondary | ICD-10-CM | POA: Diagnosis not present

## 2018-05-22 DIAGNOSIS — Z3A38 38 weeks gestation of pregnancy: Secondary | ICD-10-CM | POA: Diagnosis not present

## 2018-05-22 DIAGNOSIS — Z3A37 37 weeks gestation of pregnancy: Secondary | ICD-10-CM | POA: Diagnosis not present

## 2018-05-22 DIAGNOSIS — Z0289 Encounter for other administrative examinations: Secondary | ICD-10-CM

## 2018-05-22 DIAGNOSIS — O139 Gestational [pregnancy-induced] hypertension without significant proteinuria, unspecified trimester: Secondary | ICD-10-CM | POA: Diagnosis present

## 2018-05-22 HISTORY — DX: Anemia, unspecified: D64.9

## 2018-05-22 LAB — COMPREHENSIVE METABOLIC PANEL
ALT: 13 U/L (ref 0–44)
AST: 17 U/L (ref 15–41)
Albumin: 2.7 g/dL — ABNORMAL LOW (ref 3.5–5.0)
Alkaline Phosphatase: 100 U/L (ref 38–126)
Anion gap: 9 (ref 5–15)
BUN: 9 mg/dL (ref 6–20)
CO2: 21 mmol/L — ABNORMAL LOW (ref 22–32)
Calcium: 9 mg/dL (ref 8.9–10.3)
Chloride: 105 mmol/L (ref 98–111)
Creatinine, Ser: 0.49 mg/dL (ref 0.44–1.00)
GFR calc Af Amer: >60 mL/min (ref >60)
GFR calc non Af Amer: >60 mL/min (ref >60)
Glucose, Bld: 83 mg/dL (ref 70–99)
Potassium: 3.9 mmol/L (ref 3.5–5.1)
Sodium: 135 mmol/L (ref 135–145)
Total Bilirubin: 0.3 mg/dL (ref 0.3–1.2)
Total Protein: 6.6 g/dL (ref 6.5–8.1)

## 2018-05-22 LAB — POCT URINALYSIS DIPSTICK OB
Bilirubin, UA: NEGATIVE
Blood, UA: NEGATIVE
Glucose, UA: NEGATIVE
Ketones, UA: NEGATIVE
Nitrite, UA: NEGATIVE
Spec Grav, UA: 1.01 (ref 1.010–1.025)
Urobilinogen, UA: 0.2 E.U./dL
pH, UA: 7 (ref 5.0–8.0)

## 2018-05-22 LAB — CBC
HCT: 33.2 % — ABNORMAL LOW (ref 36.0–46.0)
Hemoglobin: 11.2 g/dL — ABNORMAL LOW (ref 12.0–15.0)
MCH: 30.1 pg (ref 26.0–34.0)
MCHC: 33.7 g/dL (ref 30.0–36.0)
MCV: 89.2 fL (ref 80.0–100.0)
Platelets: 316 10*3/uL (ref 150–400)
RBC: 3.72 MIL/uL — ABNORMAL LOW (ref 3.87–5.11)
RDW: 14.1 % (ref 11.5–15.5)
WBC: 12.4 10*3/uL — ABNORMAL HIGH (ref 4.0–10.5)
nRBC: 0 % (ref 0.0–0.2)

## 2018-05-22 LAB — PROTEIN / CREATININE RATIO, URINE
Creatinine, Urine: 87 mg/dL
Protein Creatinine Ratio: 0.15 mg/mg{Cre} (ref 0.00–0.15)
Total Protein, Urine: 13 mg/dL

## 2018-05-22 NOTE — Progress Notes (Signed)
ROB- pt came in for labs and BP check, 1st reading 136/100

## 2018-05-22 NOTE — OB Triage Note (Signed)
Pt. presented to L/D for PIH eval from the office. She reports no headache or current vision changes. She denies epigastric pain. Reflexes +2, no clonus noted.She has non-pitting edema on the LLE and RLE. She reports positive fetal movement and no bleeding or LOF. VSS at this time.Recent BP 128/86.  Will continue to monitor.

## 2018-05-22 NOTE — Progress Notes (Signed)
ROB pt  Has elevated BP in office today. 2+ swelling bilaterally , reflexes 1+ bilaterally , negative clonus. Complains of swelling and pressure behind eyes . Denies visual changes. PT sent to L&D for labs and BP checks.   Doreene Burke, CNM

## 2018-05-22 NOTE — Discharge Instructions (Signed)
Preventing Hypertension Hypertension, commonly called high blood pressure, is when the force of blood pumping through the arteries is too strong. Arteries are blood vessels that carry blood from the heart throughout the body. Over time, hypertension can damage the arteries and decrease blood flow to important parts of the body, including the brain, heart, and kidneys. Often, hypertension does not cause symptoms until blood pressure is very high. For this reason, it is important to have your blood pressure checked on a regular basis. Hypertension can often be prevented with diet and lifestyle changes. If you already have hypertension, you can control it with diet and lifestyle changes, as well as medicine. What nutrition changes can be made? Maintain a healthy diet. This includes:  Eating less salt (sodium). Ask your health care provider how much sodium is safe for you to have. The general recommendation is to consume less than 1 tsp (2,300 mg) of sodium a day. ? Do not add salt to your food. ? Choose low-sodium options when grocery shopping and eating out.  Limiting fats in your diet. You can do this by eating low-fat or fat-free dairy products and by eating less red meat.  Eating more fruits, vegetables, and whole grains. Make a goal to eat: ? 1-2 cups of fresh fruits and vegetables each day. ? 3-4 servings of whole grains each day.  Avoiding foods and beverages that have added sugars.  Eating fish that contain healthy fats (omega-3 fatty acids), such as mackerel or salmon. If you need help putting together a healthy eating plan, try the DASH diet. This diet is high in fruits, vegetables, and whole grains. It is low in sodium, red meat, and added sugars. DASH stands for Dietary Approaches to Stop Hypertension. What lifestyle changes can be made?   Lose weight if you are overweight. Losing just 3?5% of your body weight can help prevent or control hypertension. ? For example, if your present  weight is 200 lb (91 kg), a loss of 3-5% of your weight means losing 6-10 lb (2.7-4.5 kg). ? Ask your health care provider to help you with a diet and exercise plan to safely lose weight.  Get enough exercise. Do at least 150 minutes of moderate-intensity exercise each week. ? You could do this in short exercise sessions several times a day, or you could do longer exercise sessions a few times a week. For example, you could take a brisk 10-minute walk or bike ride, 3 times a day, for 5 days a week.  Find ways to reduce stress, such as exercising, meditating, listening to music, or taking a yoga class. If you need help reducing stress, ask your health care provider.  Do not smoke. This includes e-cigarettes. Chemicals in tobacco and nicotine products raise your blood pressure each time you smoke. If you need help quitting, ask your health care provider.  Avoid alcohol. If you drink alcohol, limit alcohol intake to no more than 1 drink a day for nonpregnant women and 2 drinks a day for men. One drink equals 12 oz of beer, 5 oz of wine, or 1 oz of hard liquor. Why are these changes important? Diet and lifestyle changes can help you prevent hypertension, and they may make you feel better overall and improve your quality of life. If you have hypertension, making these changes will help you control it and help prevent major complications, such as:  Hardening and narrowing of arteries that supply blood to: ? Your heart. This can cause a heart  attack. ? Your brain. This can cause a stroke. ? Your kidneys. This can cause kidney failure.  Stress on your heart muscle, which can cause heart failure. What can I do to lower my risk?  Work with your health care provider to make a hypertension prevention plan that works for you. Follow your plan and keep all follow-up visits as told by your health care provider.  Learn how to check your blood pressure at home. Make sure that you know your personal target  blood pressure, as told by your health care provider. How is this treated? In addition to diet and lifestyle changes, your health care provider may recommend medicines to help lower your blood pressure. You may need to try a few different medicines to find what works best for you. You also may need to take more than one medicine. Take over-the-counter and prescription medicines only as told by your health care provider. Where to find support Your health care provider can help you prevent hypertension and help you keep your blood pressure at a healthy level. Your local hospital or your community may also provide support services and prevention programs. The American Heart Association offers an online support network at: https://www.lee.net/ Where to find more information Learn more about hypertension from:  National Heart, Lung, and Blood Institute: https://www.peterson.org/  Centers for Disease Control and Prevention: AboutHD.co.nz  American Academy of Family Physicians: http://familydoctor.org/familydoctor/en/diseases-conditions/high-blood-pressure.printerview.all.html Learn more about the DASH diet from:  National Heart, Lung, and Blood Institute: WedMap.it Contact a health care provider if:  You think you are having a reaction to medicines you have taken.  You have recurrent headaches or feel dizzy.  You have swelling in your ankles.  You have trouble with your vision. Summary  Hypertension often does not cause any symptoms until blood pressure is very high. It is important to get your blood pressure checked regularly.  Diet and lifestyle changes are the most important steps in preventing hypertension.  By keeping your blood pressure in a healthy range, you can prevent complications like heart attack, heart failure, stroke, and kidney failure.  Work with your health care  provider to make a hypertension prevention plan that works for you. This information is not intended to replace advice given to you by your health care provider. Make sure you discuss any questions you have with your health care provider. Document Released: 01/08/2015 Document Revised: 09/04/2015 Document Reviewed: 09/04/2015 Elsevier Interactive Patient Education  2019 [employer] Inc.   Hypertension During Pregnancy  Hypertension, commonly called high blood pressure, is when the force of blood pumping through your arteries is too strong. Arteries are blood vessels that carry blood from the heart throughout the body. Hypertension during pregnancy can cause problems for you and your baby. Your baby may be born early (prematurely) or may not weigh as much as he or she should at birth. Very bad cases of hypertension during pregnancy can be life-threatening. Different types of hypertension can occur during pregnancy. These include:  Chronic hypertension. This happens when: ? You have hypertension before pregnancy and it continues during pregnancy. ? You develop hypertension before you are [redacted] weeks pregnant, and it continues during pregnancy.  Gestational hypertension. This is hypertension that develops after the 20th week of pregnancy.  Preeclampsia, also called toxemia of pregnancy. This is a very serious type of hypertension that develops during pregnancy. It can be very dangerous for you and your baby. ? In rare cases, you may develop preeclampsia after giving birth (postpartum preeclampsia). This  usually occurs within 48 hours after childbirth but may occur up to 6 weeks after giving birth. Gestational hypertension and preeclampsia usually go away within 6 weeks after your baby is born. Women who have hypertension during pregnancy have a greater chance of developing hypertension later in life or during future pregnancies. What are the causes? The exact cause of hypertension during pregnancy is  not known. What increases the risk? There are certain factors that make it more likely for you to develop hypertension during pregnancy. These include:  Having hypertension during a previous pregnancy or prior to pregnancy.  Being overweight.  Being age 25 or older.  Being pregnant for the first time.  Being pregnant with more than one baby.  Becoming pregnant using fertilization methods such as IVF (in vitro fertilization).  Having diabetes, kidney problems, or systemic lupus erythematosus.  Having a family history of hypertension. What are the signs or symptoms? Chronic hypertension and gestational hypertension rarely cause symptoms. Preeclampsia causes symptoms, which may include:  Increased protein in your urine. Your health care provider will check for this at every visit before you give birth (prenatal visit).  Severe headaches.  Sudden weight gain.  Swelling of the hands, face, legs, and feet.  Nausea and vomiting.  Vision problems, such as blurred or double vision.  Numbness in the face, arms, legs, and feet.  Dizziness.  Slurred speech.  Sensitivity to bright lights.  Abdominal pain.  Convulsions or seizures. How is this diagnosed? You may be diagnosed with hypertension during a routine prenatal exam. At each prenatal visit, you may:  Have a urine test to check for high amounts of protein in your urine.  Have your blood pressure checked. A blood pressure reading is given as two numbers, such as "120 over 80" (or 120/80). The first ("top") number is a measure of the pressure in your arteries when your heart beats (systolic pressure). The second ("bottom") number is a measure of the pressure in your arteries as your heart relaxes between beats (diastolic pressure). Blood pressure is measured in a unit called mm Hg. For most women, a normal blood pressure reading is: ? Systolic: below 120. ? Diastolic: below 80. The type of hypertension that you are  diagnosed with depends on your test results and when your symptoms developed.  Chronic hypertension is usually diagnosed before 20 weeks of pregnancy.  Gestational hypertension is usually diagnosed after 20 weeks of pregnancy.  Hypertension with high amounts of protein in the urine is diagnosed as preeclampsia.  Blood pressure measurements that stay above 160 systolic, or above 110 diastolic, are signs of severe preeclampsia. How is this treated? Treatment for hypertension during pregnancy varies depending on the type of hypertension you have and how serious it is.  If you take medicines called ACE inhibitors to treat chronic hypertension, you may need to switch medicines. ACE inhibitors should not be taken during pregnancy.  If you have gestational hypertension, you may need to take blood pressure medicine.  If you are at risk for preeclampsia, your health care provider may recommend that you take a low-dose aspirin during your pregnancy.  If you have severe preeclampsia, you may need to be hospitalized so you and your baby can be monitored closely. You may also need to take medicine (magnesium sulfate) to prevent seizures and to lower blood pressure. This medicine may be given as an injection or through an IV.  In some cases, if your condition gets worse, you may need to deliver your  baby early. Follow these instructions at home: Eating and drinking   Drink enough fluid to keep your urine pale yellow.  Avoid caffeine. Lifestyle  Do not use any products that contain nicotine or tobacco, such as cigarettes and e-cigarettes. If you need help quitting, ask your health care provider.  Do not use alcohol or drugs.  Avoid stress as much as possible. Rest and get plenty of sleep. General instructions  Take over-the-counter and prescription medicines only as told by your health care provider.  While lying down, lie on your left side. This keeps pressure off your major blood  vessels.  While sitting or lying down, raise (elevate) your feet. Try putting some pillows under your lower legs.  Exercise regularly. Ask your health care provider what kinds of exercise are best for you.  Keep all prenatal and follow-up visits as told by your health care provider. This is important. Contact a health care provider if:  You have symptoms that your health care provider told you may require more treatment or monitoring, such as: ? Nausea or vomiting. ? Headache. Get help right away if you have:  Severe abdominal pain that does not get better with treatment.  A severe headache that does not get better.  Vomiting that does not get better.  Sudden, rapid weight gain.  Sudden swelling in your hands, ankles, or face.  Vaginal bleeding.  Blood in your urine.  Fewer movements from your baby than usual.  Blurred or double vision.  Muscle twitching or sudden muscle tightening (spasms).  Shortness of breath.  Blue fingernails or lips. Summary  Hypertension, commonly called high blood pressure, is when the force of blood pumping through your arteries is too strong.  Hypertension during pregnancy can cause problems for you and your baby.  Treatment for hypertension during pregnancy varies depending on the type of hypertension you have and how serious it is.  Get help right away if you have symptoms that your health care provider told you to watch for. This information is not intended to replace advice given to you by your health care provider. Make sure you discuss any questions you have with your health care provider. Document Released: 09/11/2010 Document Revised: 12/10/2016 Document Reviewed: 06/09/2015 Elsevier Interactive Patient Education  2019 [employer].

## 2018-05-22 NOTE — Patient Instructions (Signed)
.  Preeclampsia and Eclampsia    Preeclampsia is a serious condition that may develop during pregnancy. It is also called toxemia of pregnancy. This condition causes high blood pressure along with other symptoms, such as swelling and headaches. These symptoms may develop as the condition gets worse. Preeclampsia may occur at 20 weeks of pregnancy or later.  Diagnosing and treating preeclampsia early is very important. If not treated early, it can cause serious problems for you and your baby. One problem it can lead to is eclampsia. Eclampsia is a condition that causes muscle jerking or shaking (convulsions or seizures) and other serious problems for the mother. During pregnancy, delivering your baby may be the best treatment for preeclampsia or eclampsia. For most women, preeclampsia and eclampsia symptoms go away after giving birth.  In rare cases, a woman may develop preeclampsia after giving birth (postpartum preeclampsia). This usually occurs within 48 hours after childbirth but may occur up to 6 weeks after giving birth.  What are the causes?  The cause of preeclampsia is not known.  What increases the risk?  The following risk factors make you more likely to develop preeclampsia:   Being pregnant for the first time.   Having had preeclampsia during a past pregnancy.   Having a family history of preeclampsia.   Having high blood pressure.   Being pregnant with more than one baby.   Being 35 or older.   Being African-American.   Having kidney disease or diabetes.   Having medical conditions such as lupus or blood diseases.   Being very overweight (obese).  What are the signs or symptoms?  The earliest signs of preeclampsia are:   High blood pressure.   Increased protein in your urine. Your health care provider will check for this at every visit before you give birth (prenatal visit).  Other symptoms that may develop as the condition gets worse include:   Severe headaches.   Sudden weight  gain.   Swelling of the hands, face, legs, and feet.   Nausea and vomiting.   Vision problems, such as blurred or double vision.   Numbness in the face, arms, legs, and feet.   Urinating less than usual.   Dizziness.   Slurred speech.   Abdominal pain, especially upper abdominal pain.   Convulsions or seizures.  How is this diagnosed?  There are no screening tests for preeclampsia. Your health care provider will ask you about symptoms and check for signs of preeclampsia during your prenatal visits. You may also have tests that include:   Urine tests.   Blood tests.   Checking your blood pressure.   Monitoring your baby's heart rate.   Ultrasound.  How is this treated?  You and your health care provider will determine the treatment approach that is best for you. Treatment may include:   Having more frequent prenatal exams to check for signs of preeclampsia, if you have an increased risk for preeclampsia.   Medicine to lower your blood pressure.   Staying in the hospital, if your condition is severe. There, treatment will focus on controlling your blood pressure and the amount of fluids in your body (fluid retention).   Taking medicine (magnesium sulfate) to prevent seizures. This may be given as an injection or through an IV.   Taking a low-dose aspirin during your pregnancy.   Delivering your baby early, if your condition gets worse. You may have your labor started with medicine (induced), or you may have a cesarean   delivery.  Follow these instructions at home:  Eating and drinking     Drink enough fluid to keep your urine pale yellow.   Avoid caffeine.  Lifestyle   Do not use any products that contain nicotine or tobacco, such as cigarettes and e-cigarettes. If you need help quitting, ask your health care provider.   Do not use alcohol or drugs.   Avoid stress as much as possible. Rest and get plenty of sleep.  General instructions   Take over-the-counter and prescription medicines only as  told by your health care provider.   When lying down, lie on your left side. This keeps pressure off your major blood vessels.   When sitting or lying down, raise (elevate) your feet. Try putting some pillows underneath your lower legs.   Exercise regularly. Ask your health care provider what kinds of exercise are best for you.   Keep all follow-up and prenatal visits as told by your health care provider. This is important.  How is this prevented?  There is no known way of preventing preeclampsia or eclampsia from developing. However, to lower your risk of complications and detect problems early:   Get regular prenatal care. Your health care provider may be able to diagnose and treat the condition early.   Maintain a healthy weight. Ask your health care provider for help managing weight gain during pregnancy.   Work with your health care provider to manage any long-term (chronic) health conditions you have, such as diabetes or kidney problems.   You may have tests of your blood pressure and kidney function after giving birth.   Your health care provider may have you take low-dose aspirin during your next pregnancy.  Contact a health care provider if:   You have symptoms that your health care provider told you may require more treatment or monitoring, such as:  ? Headaches.  ? Nausea or vomiting.  ? Abdominal pain.  ? Dizziness.  ? Light-headedness.  Get help right away if:   You have severe:  ? Abdominal pain.  ? Headaches that do not get better.  ? Dizziness.  ? Vision problems.  ? Confusion.  ? Nausea or vomiting.   You have any of the following:  ? A seizure.  ? Sudden, rapid weight gain.  ? Sudden swelling in your hands, ankles, or face.  ? Trouble moving any part of your body.  ? Numbness in any part of your body.  ? Trouble speaking.  ? Abnormal bleeding.   You faint.  Summary   Preeclampsia is a serious condition that may develop during pregnancy. It is also called toxemia of pregnancy.   This  condition causes high blood pressure along with other symptoms, such as swelling and headaches.   Diagnosing and treating preeclampsia early is very important. If not treated early, it can cause serious problems for you and your baby.   Get help right away if you have symptoms that your health care provider told you to watch for.  This information is not intended to replace advice given to you by your health care provider. Make sure you discuss any questions you have with your health care provider.  Document Released: 12/22/1999 Document Revised: 12/10/2016 Document Reviewed: 07/31/2015  Elsevier Interactive Patient Education  2019 Elsevier Inc.

## 2018-05-22 NOTE — OB Triage Provider Note (Signed)
Sarah Blankenship is a 23 y.o. G1P0000 at [redacted]w[redacted]d who is here in observation for blood pressure evaluation.  Estimated Date of Delivery: 06/04/18 Fetal presentation is cephalic.  Length of Stay:  0 Days. Admitted 05/22/2018  Subjective: Denies headache or visual changes, just feels tired. Patient reports good fetal movement.  She reports no known uterine contractions, no bleeding and no loss of fluid per vagina.  Vitals:  Blood pressure 128/78, pulse 71, temperature 98.4 F (36.9 C), temperature source Oral, resp. rate 18, height 5' (1.524 m), weight 81.2 kg, last menstrual period 08/28/2017. Physical Examination: CONSTITUTIONAL: Well-developed, well-nourished female in no acute distress.  SKIN: Skin is warm and dry. No rash noted. Not diaphoretic. No erythema. No pallor. NEUROLGIC: Alert and oriented to person, place, and time. Normal reflexes, muscle tone coordination. No cranial nerve deficit noted. PSYCHIATRIC: Normal mood and affect. Normal behavior. Normal judgment and thought content. CARDIOVASCULAR: Normal heart rate noted, regular rhythm RESPIRATORY: Effort and breath sounds normal, no problems with respiration noted MUSCULOSKELETAL: Normal range of motion. Mild nonpitting edema and no tenderness. 2+ distal pulses. ABDOMEN: Soft, nontender, nondistended, gravid. CERVIX:  1cm per Doreene Burke CNM in office earlier today.  Fetal monitoring: FHR: 150 bpm, Variability: moderate, Accelerations: Present, Decelerations: Absent  Uterine activity: 4-6 mild contractions per hour  Results for orders placed or performed during the hospital encounter of 05/22/18 (from the past 48 hour(s))  Protein / creatinine ratio, urine     Status: None   Collection Time: 05/22/18 11:08 AM  Result Value Ref Range   Creatinine, Urine 87 mg/dL   Total Protein, Urine 13 mg/dL    Comment: NO NORMAL RANGE ESTABLISHED FOR THIS TEST   Protein Creatinine Ratio 0.15 0.00 - 0.15 mg/mg[Cre]    Comment: Performed  at [facility], [address] [place], [place] [address]  CBC     Status: Abnormal   Collection Time: 05/22/18 12:15 PM  Result Value Ref Range   WBC 12.4 (H) 4.0 - 10.5 K/uL   RBC 3.72 (L) 3.87 - 5.11 MIL/uL   Hemoglobin 11.2 (L) 12.0 - 15.0 g/dL   HCT 64.3 (L) 83.8 - 18.4 %   MCV 89.2 80.0 - 100.0 fL   MCH 30.1 26.0 - 34.0 pg   MCHC 33.7 30.0 - 36.0 g/dL   RDW 03.7 54.3 - 60.6 %   Platelets 316 150 - 400 K/uL   nRBC 0.0 0.0 - 0.2 %    Comment: Performed at [facility], [address] [place], [place] [address]    No results found.  Current scheduled medications   I have reviewed the patient's current medications.  ASSESSMENT: Patient Active Problem List   Diagnosis Date Noted  . Hand swelling 05/18/2018  . Bilateral hand swelling 05/18/2018  gestational hypertension  PLAN: Discharged on modified bedrest, OOW for remainder of pregnancy. PIH precautions discussed. To return to office in 4 days for BP check, NST, and ultrasound. Continue routine antenatal    N , CNM [facility]

## 2018-05-23 LAB — COMPREHENSIVE METABOLIC PANEL
ALT: 11 IU/L (ref 0–32)
AST: 15 IU/L (ref 0–40)
Albumin/Globulin Ratio: 1.1 — ABNORMAL LOW (ref 1.2–2.2)
Albumin: 3.3 g/dL — ABNORMAL LOW (ref 3.9–5.0)
Alkaline Phosphatase: 108 IU/L (ref 39–117)
BUN/Creatinine Ratio: 16 (ref 9–23)
BUN: 9 mg/dL (ref 6–20)
Bilirubin Total: <0.2 mg/dL (ref 0.0–1.2)
CO2: 19 mmol/L — ABNORMAL LOW (ref 20–29)
Calcium: 9 mg/dL (ref 8.7–10.2)
Chloride: 103 mmol/L (ref 96–106)
Creatinine, Ser: 0.56 mg/dL — ABNORMAL LOW (ref 0.57–1.00)
GFR calc Af Amer: 150 mL/min/{1.73_m2} (ref >59)
GFR calc non Af Amer: 130 mL/min/{1.73_m2} (ref >59)
Globulin, Total: 2.9 g/dL (ref 1.5–4.5)
Glucose: 100 mg/dL — ABNORMAL HIGH (ref 65–99)
Potassium: 4.5 mmol/L (ref 3.5–5.2)
Sodium: 137 mmol/L (ref 134–144)
Total Protein: 6.2 g/dL (ref 6.0–8.5)

## 2018-05-23 LAB — CBC
Hematocrit: 32.9 % — ABNORMAL LOW (ref 34.0–46.6)
Hemoglobin: 11.3 g/dL (ref 11.1–15.9)
MCH: 29.7 pg (ref 26.6–33.0)
MCHC: 34.3 g/dL (ref 31.5–35.7)
MCV: 86 fL (ref 79–97)
Platelets: 324 10*3/uL (ref 150–450)
RBC: 3.81 x10E6/uL (ref 3.77–5.28)
RDW: 13.9 % (ref 11.7–15.4)
WBC: 11.8 10*3/uL — ABNORMAL HIGH (ref 3.4–10.8)

## 2018-05-23 LAB — PROTEIN / CREATININE RATIO, URINE
Creatinine, Urine: 102 mg/dL
Protein, Ur: 19.6 mg/dL
Protein/Creat Ratio: 192 mg/g creat (ref 0–200)

## 2018-05-26 ENCOUNTER — Encounter: Payer: Self-pay | Admitting: Certified Nurse Midwife

## 2018-05-26 ENCOUNTER — Other Ambulatory Visit: Payer: 59

## 2018-05-26 ENCOUNTER — Other Ambulatory Visit: Payer: Self-pay | Admitting: Certified Nurse Midwife

## 2018-05-26 ENCOUNTER — Other Ambulatory Visit: Payer: Self-pay

## 2018-05-26 ENCOUNTER — Ambulatory Visit (INDEPENDENT_AMBULATORY_CARE_PROVIDER_SITE_OTHER): Payer: 59

## 2018-05-26 ENCOUNTER — Ambulatory Visit: Payer: 59 | Admitting: Certified Nurse Midwife

## 2018-05-26 VITALS — BP 134/83 | HR 86 | Wt 180.4 lb

## 2018-05-26 DIAGNOSIS — Z3A38 38 weeks gestation of pregnancy: Secondary | ICD-10-CM

## 2018-05-26 DIAGNOSIS — O4103X0 Oligohydramnios, third trimester, not applicable or unspecified: Secondary | ICD-10-CM

## 2018-05-26 DIAGNOSIS — Z3492 Encounter for supervision of normal pregnancy, unspecified, second trimester: Secondary | ICD-10-CM

## 2018-05-26 DIAGNOSIS — Z3493 Encounter for supervision of normal pregnancy, unspecified, third trimester: Secondary | ICD-10-CM

## 2018-05-26 DIAGNOSIS — Z3403 Encounter for supervision of normal first pregnancy, third trimester: Secondary | ICD-10-CM

## 2018-05-26 LAB — POCT URINALYSIS DIPSTICK OB
Bilirubin, UA: NEGATIVE
Blood, UA: NEGATIVE
Glucose, UA: NEGATIVE
Ketones, UA: NEGATIVE
Nitrite, UA: NEGATIVE
POC,PROTEIN,UA: NEGATIVE
Spec Grav, UA: 1.01 (ref 1.010–1.025)
Urobilinogen, UA: 0.2 E.U./dL
pH, UA: 6.5 (ref 5.0–8.0)

## 2018-05-26 NOTE — Patient Instructions (Signed)
Labor Induction    Labor induction is when steps are taken to cause a pregnant woman to begin the labor process. Most women go into labor on their own between 37 weeks and 42 weeks of pregnancy. When this does not happen or when there is a medical need for labor to begin, steps may be taken to induce labor. Labor induction causes a pregnant woman's uterus to contract. It also causes the cervix to soften (ripen), open (dilate), and thin out (efface). Usually, labor is not induced before 39 weeks of pregnancy unless there is a medical reason to do so. Your health care provider will determine if labor induction is needed.  Before inducing labor, your health care provider will consider a number of factors, including:  · Your medical condition and your baby's.  · How many weeks along you are in your pregnancy.  · How mature your baby's lungs are.  · The condition of your cervix.  · The position of your baby.  · The size of your birth canal.  What are some reasons for labor induction?  Labor may be induced if:  · Your health or your baby's health is at risk.  · Your pregnancy is overdue by 1 week or more.  · Your water breaks but labor does not start on its own.  · There is a low amount of amniotic fluid around your baby.  You may also choose (elect) to have labor induced at a certain time. Generally, elective labor induction is done no earlier than 39 weeks of pregnancy.  What methods are used for labor induction?  Methods used for labor induction include:  · Prostaglandin medicine. This medicine starts contractions and causes the cervix to dilate and ripen. It can be taken by mouth (orally) or by being inserted into the vagina (suppository).  · Inserting a small, thin tube (catheter) with a balloon into the vagina and then expanding the balloon with water to dilate the cervix.  · Stripping the membranes. In this method, your health care provider gently separates amniotic sac tissue from the cervix. This causes the  cervix to stretch, which in turn causes the release of a hormone called progesterone. The hormone causes the uterus to contract. This procedure is often done during an office visit, after which you will be sent home to wait for contractions to begin.  · Breaking the water. In this method, your health care provider uses a small instrument to make a small hole in the amniotic sac. This eventually causes the amniotic sac to break. Contractions should begin after a few hours.  · Medicine to trigger or strengthen contractions. This medicine is given through an IV that is inserted into a vein in your arm.  Except for membrane stripping, which can be done in a clinic, labor induction is done in the hospital so that you and your baby can be carefully monitored.  How long does it take for labor to be induced?  The length of time it takes to induce labor depends on how ready your body is for labor. Some inductions can take up to 2-3 days, while others may take less than a day. Induction may take longer if:  · You are induced early in your pregnancy.  · It is your first pregnancy.  · Your cervix is not ready.  What are some risks associated with labor induction?  Some risks associated with labor induction include:  · Changes in fetal heart rate, such as being too   high, too low, or irregular (erratic).  · Failed induction.  · Infection in the mother or the baby.  · Increased risk of having a cesarean delivery.  · Fetal death.  · Breaking off (abruption) of the placenta from the uterus (rare).  · Rupture of the uterus (very rare).  When induction is needed for medical reasons, the benefits of induction generally outweigh the risks.  What are some reasons for not inducing labor?  Labor induction should not be done if:  · Your baby does not tolerate contractions.  · You have had previous surgeries on your uterus, such as a myomectomy, removal of fibroids, or a vertical scar from a previous cesarean delivery.  · Your placenta lies  very low in your uterus and blocks the opening of the cervix (placenta previa).  · Your baby is not in a head-down position.  · The umbilical cord drops down into the birth canal in front of the baby.  · There are unusual circumstances, such as the baby being very early (premature).  · You have had more than 2 previous cesarean deliveries.  Summary  · Labor induction is when steps are taken to cause a pregnant woman to begin the labor process.  · Labor induction causes a pregnant woman's uterus to contract. It also causes the cervix to ripen, dilate, and efface.  · Labor is not induced before 39 weeks of pregnancy unless there is a medical reason to do so.  · When induction is needed for medical reasons, the benefits of induction generally outweigh the risks.  This information is not intended to replace advice given to you by your health care provider. Make sure you discuss any questions you have with your health care provider.  Document Released: 05/15/2006 Document Revised: 02/07/2016 Document Reviewed: 02/07/2016  Elsevier Interactive Patient Education © 2019 Elsevier Inc[employer]

## 2018-05-26 NOTE — Progress Notes (Signed)
NST and ROB today. NST reactive baseline 145, accelerations present, decelerations absent, moderate variability, no contractions noted. U/s yesterday for AFI/growth ( see below) AFI 2.4. Dr. Valentino Saxon consulted. Plan for induction at 39 wks.SVE 2-3cm/70/-2.  Discussed with pt. Pt agrees to plan of care. Will call with induction date and time. Reviewed fetal kick counts .   Doreene Burke, CNM   Patient Name: Sarah Blankenship DOB: June 11, 1995 MRN: [id number] ULTRASOUND REPORT  Location: [facility] Date of Service: 05/26/2018   Indications:growth/afi Findings:  [name] intrauterine pregnancy is visualized with FHR at 147 BPM. Biometrics give an (U/S) Gestational age of [redacted]w[redacted]d and an (U/S) EDD of 07/01/2018; this does not correlates with the clinically established Estimated Date of Delivery: 06/04/18.  Fetal presentation is Cephalic.  Placenta: anterior. Grade: 1 AFI: 2.4 cm  Growth percentile is 35. EFW: 2498 g ( 5 lbs 8 oz ) Impression: 1. [redacted]w[redacted]d Viable Singleton Intrauterine pregnancy previously established criteria. 2. Growth is 35 %ile.  AFI is 2.5 cm.  3. Oligohydramnios 4. Measuring 3 weeks behind   Recommendations: 1.Clinical correlation with the patient's History and Physical Exam.   [name]     RDMS

## 2018-05-28 ENCOUNTER — Inpatient Hospital Stay: Payer: 59 | Admitting: Anesthesiology

## 2018-05-28 ENCOUNTER — Other Ambulatory Visit: Payer: Self-pay

## 2018-05-28 ENCOUNTER — Encounter: Payer: Self-pay | Admitting: *Deleted

## 2018-05-28 ENCOUNTER — Inpatient Hospital Stay
Admission: EM | Admit: 2018-05-28 | Discharge: 2018-05-30 | DRG: 807 | Disposition: A | Discharge status: Home / Self Care | Payer: 59 | Attending: Obstetrics and Gynecology | Admitting: Obstetrics and Gynecology

## 2018-05-28 DIAGNOSIS — O4100X0 Oligohydramnios, unspecified trimester, not applicable or unspecified: Secondary | ICD-10-CM | POA: Diagnosis present

## 2018-05-28 DIAGNOSIS — O134 Gestational [pregnancy-induced] hypertension without significant proteinuria, complicating childbirth: Secondary | ICD-10-CM | POA: Diagnosis present

## 2018-05-28 DIAGNOSIS — Z3A39 39 weeks gestation of pregnancy: Secondary | ICD-10-CM | POA: Diagnosis not present

## 2018-05-28 DIAGNOSIS — O4103X0 Oligohydramnios, third trimester, not applicable or unspecified: Principal | ICD-10-CM | POA: Diagnosis present

## 2018-05-28 DIAGNOSIS — O9902 Anemia complicating childbirth: Secondary | ICD-10-CM | POA: Diagnosis present

## 2018-05-28 DIAGNOSIS — O99824 Streptococcus B carrier state complicating childbirth: Secondary | ICD-10-CM | POA: Diagnosis present

## 2018-05-28 DIAGNOSIS — D500 Iron deficiency anemia secondary to blood loss (chronic): Secondary | ICD-10-CM | POA: Diagnosis present

## 2018-05-28 DIAGNOSIS — Z1159 Encounter for screening for other viral diseases: Secondary | ICD-10-CM

## 2018-05-28 DIAGNOSIS — O139 Gestational [pregnancy-induced] hypertension without significant proteinuria, unspecified trimester: Secondary | ICD-10-CM

## 2018-05-28 LAB — CBC
HCT: 33.1 % — ABNORMAL LOW (ref 36.0–46.0)
Hemoglobin: 11.1 g/dL — ABNORMAL LOW (ref 12.0–15.0)
MCH: 30.5 pg (ref 26.0–34.0)
MCHC: 33.5 g/dL (ref 30.0–36.0)
MCV: 90.9 fL (ref 80.0–100.0)
Platelets: 300 10*3/uL (ref 150–400)
RBC: 3.64 MIL/uL — ABNORMAL LOW (ref 3.87–5.11)
RDW: 14.4 % (ref 11.5–15.5)
WBC: 12.3 10*3/uL — ABNORMAL HIGH (ref 4.0–10.5)
nRBC: 0 % (ref 0.0–0.2)

## 2018-05-28 LAB — COMPREHENSIVE METABOLIC PANEL
ALT: 12 U/L (ref 0–44)
AST: 17 U/L (ref 15–41)
Albumin: 2.6 g/dL — ABNORMAL LOW (ref 3.5–5.0)
Alkaline Phosphatase: 96 U/L (ref 38–126)
Anion gap: 9 (ref 5–15)
BUN: 12 mg/dL (ref 6–20)
CO2: 22 mmol/L (ref 22–32)
Calcium: 8.6 mg/dL — ABNORMAL LOW (ref 8.9–10.3)
Chloride: 104 mmol/L (ref 98–111)
Creatinine, Ser: 0.63 mg/dL (ref 0.44–1.00)
GFR calc Af Amer: >60 mL/min (ref >60)
GFR calc non Af Amer: >60 mL/min (ref >60)
Glucose, Bld: 95 mg/dL (ref 70–99)
Potassium: 4 mmol/L (ref 3.5–5.1)
Sodium: 135 mmol/L (ref 135–145)
Total Bilirubin: 0.2 mg/dL — ABNORMAL LOW (ref 0.3–1.2)
Total Protein: 6.5 g/dL (ref 6.5–8.1)

## 2018-05-28 LAB — TYPE AND SCREEN
ABO/RH(D): AB POS
Antibody Screen: NEGATIVE

## 2018-05-28 LAB — PROTEIN / CREATININE RATIO, URINE
Creatinine, Urine: 92 mg/dL
Protein Creatinine Ratio: 0.15 mg/mg{Cre} (ref 0.00–0.15)
Total Protein, Urine: 14 mg/dL

## 2018-05-28 LAB — SARS CORONAVIRUS 2 BY RT PCR (HOSPITAL ORDER, PERFORMED IN ~~LOC~~ HOSPITAL LAB): SARS Coronavirus 2: NEGATIVE

## 2018-05-28 MED ORDER — DIPHENHYDRAMINE HCL 25 MG PO CAPS: 25.0000 mg | ORAL_CAPSULE | Freq: Four times a day (QID) | ORAL | Status: DC | PRN

## 2018-05-28 MED ORDER — AMMONIA AROMATIC IN INHA
RESPIRATORY_TRACT | Status: AC
  Filled 2018-05-28: qty 10

## 2018-05-28 MED ORDER — LACTATED RINGERS IV SOLN
500.0000 mL | INTRAVENOUS | Status: DC | PRN
  Administered 2018-05-28: 500 mL via INTRAVENOUS

## 2018-05-28 MED ORDER — PHENYLEPHRINE 40 MCG/ML (10ML) SYRINGE FOR IV PUSH (FOR BLOOD PRESSURE SUPPORT)
80.0000 ug | PREFILLED_SYRINGE | INTRAVENOUS | Status: DC | PRN
  Filled 2018-05-28: qty 10

## 2018-05-28 MED ORDER — TERBUTALINE SULFATE 1 MG/ML IJ SOLN: 0.2500 mg | Freq: Once | INTRAMUSCULAR | Status: DC | PRN

## 2018-05-28 MED ORDER — PRENATAL MULTIVITAMIN CH
1.0000 | ORAL_TABLET | Freq: Every day | ORAL | Status: DC
  Administered 2018-05-29: 14:00:00 1 via ORAL
  Filled 2018-05-28 (×2): qty 1

## 2018-05-28 MED ORDER — LACTATED RINGERS IV SOLN
INTRAVENOUS | Status: DC
  Administered 2018-05-28 (×2): via INTRAVENOUS

## 2018-05-28 MED ORDER — LIDOCAINE HCL (PF) 1 % IJ SOLN: 30.0000 mL | INTRAMUSCULAR | Status: DC | PRN

## 2018-05-28 MED ORDER — DIPHENHYDRAMINE HCL 50 MG/ML IJ SOLN: 12.5000 mg | INTRAMUSCULAR | Status: DC | PRN

## 2018-05-28 MED ORDER — DOCUSATE SODIUM 100 MG PO CAPS
100.0000 mg | ORAL_CAPSULE | Freq: Two times a day (BID) | ORAL | Status: DC
  Administered 2018-05-29 – 2018-05-30 (×3): 100 mg via ORAL
  Filled 2018-05-28 (×3): qty 1

## 2018-05-28 MED ORDER — ONDANSETRON HCL 4 MG PO TABS: 4.0000 mg | ORAL_TABLET | ORAL | Status: DC | PRN

## 2018-05-28 MED ORDER — LACTATED RINGERS AMNIOINFUSION
INTRAVENOUS | Status: DC
  Administered 2018-05-28: 10:00:00 via INTRAUTERINE
  Filled 2018-05-28 (×3): qty 1000

## 2018-05-28 MED ORDER — ACETAMINOPHEN 325 MG PO TABS: 650.0000 mg | ORAL_TABLET | ORAL | Status: DC | PRN

## 2018-05-28 MED ORDER — FENTANYL 2.5 MCG/ML W/ROPIVACAINE 0.15% IN NS 100 ML EPIDURAL (ARMC)
EPIDURAL | Status: AC
  Filled 2018-05-28: qty 100

## 2018-05-28 MED ORDER — MISOPROSTOL 200 MCG PO TABS
ORAL_TABLET | ORAL | Status: AC
  Filled 2018-05-28: qty 4

## 2018-05-28 MED ORDER — COCONUT OIL OIL
1.0000 "application " | TOPICAL_OIL | Status: DC | PRN
  Filled 2018-05-28: qty 120

## 2018-05-28 MED ORDER — OXYTOCIN BOLUS FROM INFUSION
500.0000 mL | Freq: Once | INTRAVENOUS | Status: AC
  Administered 2018-05-28: 500 mL via INTRAVENOUS

## 2018-05-28 MED ORDER — OXYTOCIN 40 UNITS IN NORMAL SALINE INFUSION - SIMPLE MED: 2.5000 [IU]/h | INTRAVENOUS | Status: DC

## 2018-05-28 MED ORDER — FENTANYL 2.5 MCG/ML W/ROPIVACAINE 0.15% IN NS 100 ML EPIDURAL (ARMC)
12.0000 mL/h | EPIDURAL | Status: DC
  Administered 2018-05-28: 12 mL/h via EPIDURAL

## 2018-05-28 MED ORDER — DIBUCAINE (PERIANAL) 1 % EX OINT
1.0000 "application " | TOPICAL_OINTMENT | CUTANEOUS | Status: DC | PRN
  Filled 2018-05-28: qty 28

## 2018-05-28 MED ORDER — IBUPROFEN 600 MG PO TABS
600.0000 mg | ORAL_TABLET | Freq: Four times a day (QID) | ORAL | Status: DC
  Administered 2018-05-29 – 2018-05-30 (×4): 600 mg via ORAL
  Filled 2018-05-28 (×4): qty 1

## 2018-05-28 MED ORDER — OXYTOCIN 10 UNIT/ML IJ SOLN
INTRAMUSCULAR | Status: AC
  Filled 2018-05-28: qty 2

## 2018-05-28 MED ORDER — BENZOCAINE-MENTHOL 20-0.5 % EX AERO
1.0000 "application " | INHALATION_SPRAY | CUTANEOUS | Status: DC | PRN
  Filled 2018-05-28: qty 56

## 2018-05-28 MED ORDER — ONDANSETRON HCL 4 MG/2ML IJ SOLN
4.0000 mg | Freq: Four times a day (QID) | INTRAMUSCULAR | Status: DC | PRN
  Administered 2018-05-28: 4 mg via INTRAVENOUS
  Filled 2018-05-28: qty 2

## 2018-05-28 MED ORDER — WITCH HAZEL-GLYCERIN EX PADS
1.0000 "application " | MEDICATED_PAD | CUTANEOUS | Status: DC | PRN
  Filled 2018-05-28 (×2): qty 100

## 2018-05-28 MED ORDER — ONDANSETRON HCL 4 MG/2ML IJ SOLN: 4.0000 mg | INTRAMUSCULAR | Status: DC | PRN

## 2018-05-28 MED ORDER — SODIUM CHLORIDE 0.9 % IV SOLN
2.0000 g | Freq: Once | INTRAVENOUS | Status: AC
  Administered 2018-05-28: 2 g via INTRAVENOUS
  Filled 2018-05-28: qty 2000

## 2018-05-28 MED ORDER — LIDOCAINE HCL (PF) 1 % IJ SOLN
INTRAMUSCULAR | Status: AC
  Filled 2018-05-28: qty 30

## 2018-05-28 MED ORDER — LACTATED RINGERS IV SOLN: 500.0000 mL | Freq: Once | INTRAVENOUS | Status: AC

## 2018-05-28 MED ORDER — SODIUM CHLORIDE 0.9 % IV SOLN
1.0000 g | INTRAVENOUS | Status: DC
  Administered 2018-05-28 (×2): 1 g via INTRAVENOUS
  Filled 2018-05-28 (×6): qty 1000

## 2018-05-28 MED ORDER — SIMETHICONE 80 MG PO CHEW: 80.0000 mg | CHEWABLE_TABLET | ORAL | Status: DC | PRN

## 2018-05-28 MED ORDER — EPHEDRINE 5 MG/ML INJ
10.0000 mg | INTRAVENOUS | Status: DC | PRN
  Filled 2018-05-28: qty 2

## 2018-05-28 MED ORDER — OXYTOCIN 40 UNITS IN NORMAL SALINE INFUSION - SIMPLE MED
1.0000 m[IU]/min | INTRAVENOUS | Status: DC
  Administered 2018-05-28: 2 m[IU]/min via INTRAVENOUS
  Filled 2018-05-28: qty 1000

## 2018-05-28 MED ORDER — FENTANYL CITRATE (PF) 100 MCG/2ML IJ SOLN
50.0000 ug | INTRAMUSCULAR | Status: DC | PRN
  Administered 2018-05-28 (×3): 50 ug via INTRAVENOUS
  Filled 2018-05-28 (×3): qty 2

## 2018-05-28 MED ORDER — BUPIVACAINE HCL (PF) 0.25 % IJ SOLN
INTRAMUSCULAR | Status: DC | PRN
  Administered 2018-05-28: 5 mL via EPIDURAL

## 2018-05-28 NOTE — Progress Notes (Signed)
Sarah Blankenship is a 23 y.o. G1P0000 at [redacted]w[redacted]d by LMP admitted for induction of labor due to Low amniotic fluid..  Subjective: Reports mild pressure with contractions  Objective: BP 128/79   Pulse 72   Temp 98.2 F (36.8 C) (Oral)   Resp 15   Ht 5' (1.524 m)   Wt 81.6 kg   LMP 08/28/2017   BMI 35.15 kg/m  No intake/output data recorded. Total I/O In: 696 [I.V.:596; IV Piggyback:100] Out: -   FHT:  FHR: 160 bpm, variability: minimal ,  accelerations:  Present,  decelerations:  Present variable with late component, resolved with position changes.  UC:   regular, every 4-5 minutes on 4 mu/min pitocin- stopped at onset of decels  SVE:   Dilation: 3.5 Effacement (%): 80 Station: -2 Exam by:: [employer]: Lab Results  Component Value Date   WBC 12.3 (H) 05/28/2018   HGB 11.1 (L) 05/28/2018   HCT 33.1 (L) 05/28/2018   MCV 90.9 05/28/2018   PLT 300 05/28/2018    Assessment / Plan: IOL with pitocin per protocol, fetal decels noted, AROM with scant amount clear fluid noted, IUPC placed for amnioinfusion. Dr Valentino Saxon aware.  Labor: IOL started Preeclampsia:  labs stable Fetal Wellbeing:  Category II Pain Control:  Labor support without medications I/D:  n/a Anticipated MOD:  hopeful for SVD  Jedi Catalfamo N Melda Mermelstein 05/28/2018, 9:58 AM

## 2018-05-28 NOTE — Progress Notes (Signed)
AVISHKA STATLER is a 23 y.o. G1P0000 at [redacted]w[redacted]d by LMP admitted for induction of labor due to Low amniotic fluid..  Subjective: Reports RLQ pressure with contractions, even with epidural in place  Objective: BP 128/72   Pulse 78   Temp 98.1 F (36.7 C) (Oral)   Resp 18   Ht 5' (1.524 m)   Wt 81.6 kg   LMP 08/28/2017   SpO2 96%   BMI 35.15 kg/m  No intake/output data recorded. Total I/O In: 2055.1 [I.V.:1855.1; IV Piggyback:200] Out: -   FHT:  FHR: 158 bpm, variability: moderate,  accelerations:  Present,  decelerations:  Present early UC:   regular, every 2-3 minutes SVE:   Dilation: 10 Effacement (%): 100 Station: 0 Exam by:: [employer]: Lab Results  Component Value Date   WBC 12.3 (H) 05/28/2018   HGB 11.1 (L) 05/28/2018   HCT 33.1 (L) 05/28/2018   MCV 90.9 05/28/2018   PLT 300 05/28/2018    Assessment / Plan: Induction of labor due to oligohydramnious,  progressing well on pitocin, has begun second stage & is pushing fair.  Labor: Progressing normally Preeclampsia:  labs stable Fetal Wellbeing:  Category I Pain Control:  Epidural I/D:  n/a Anticipated MOD:  NSVD  Melody N Shambley 05/28/2018, 5:43 PM

## 2018-05-28 NOTE — H&P (Signed)
Obstetric History and Physical  Sarah BenderCatherine A Blankenship is a 23 y.o. G1P0000 with IUP at 6089w0d presenting with orders for IOL due to oligohydramnios. Patient states she has been having  irregular, every 6-7 minutes contractions, none vaginal bleeding, intact membranes, with active fetal movement.    Prenatal Course [facility]  Pregnancy complications or risks:intermittent elevated blood pressures with reassuring labs, oligohydramnious  Prenatal labs and studies: ABO, Rh: --/--/PENDING (05/21 0549) Antibody: PENDING (05/21 0549) Rubella: 2.28 (10/18 0923) RPR: Non Reactive (03/10 1115)  HBsAg: Negative (10/18 0923)  HIV: Non Reactive (10/18 0923)  NFA:OZHYQMVHGBS:Positive (04/28 1414) 1 hr Glucola  normal Genetic screening normal Anatomy US normal  Past Medical History:  Diagnosis Date  . Anemia   . Seasonal allergies   . Tachycardia     Past Surgical History:  Procedure Laterality Date  . MULTIPLE TOOTH EXTRACTIONS    . NASAL FRACTURE SURGERY      OB History  Gravida Para Term Preterm AB Living  1 0 0 0 0 0  SAB TAB Ectopic Multiple Live Births  0 0 0 0 0    # Outcome Date GA Lbr Len/2nd Weight Sex Delivery Anes PTL Lv  1 Current             Social History   Socioeconomic History  . Marital status: Married    Spouse name: Hessie Dienerlan  . Number of children: Not on file  . Years of education: Not on file  . Highest education level: Not on file  Occupational History  . Not on file  Social Needs  . Financial resource strain: Not hard at all  . Food insecurity:    Worry: Patient refused    Inability: Patient refused  . Transportation needs:    Medical: Patient refused    Non-medical: Patient refused  Tobacco Use  . Smoking status: Never Smoker  . Smokeless tobacco: Never Used  Substance and Sexual Activity  . Alcohol use: No  . Drug use: No  . Sexual activity: Yes    Birth control/protection: None, I.U.D.  Lifestyle  . Physical activity:    Days per week: Patient  refused    Minutes per session: Patient refused  . Stress: Not at all  Relationships  . Social connections:    Talks on phone: More than three times a week    Gets together: More than three times a week    Attends religious service: Never    Active member of club or organization: No    Attends meetings of clubs or organizations: Never    Relationship status: Married  Other Topics Concern  . Not on file  Social History Narrative  . Not on file    Family History  Problem Relation Age of Onset  . Thyroid disease Paternal Grandmother   . Diabetes Maternal Grandmother   . Breast cancer Maternal Grandmother   . Diabetes Maternal Grandfather   . Diabetes Mother   . Ovarian cancer Neg Hx   . Colon cancer Neg Hx     Medications Prior to Admission  Medication Sig Dispense Refill Last Dose  . cetirizine (ZYRTEC) 10 MG tablet Take 10 mg by mouth daily.   05/28/2018 at Unknown time  . Prenatal Vit-Fe Fumarate-FA (PRENATAL MULTIVITAMIN) TABS tablet Take 1 tablet by mouth daily at 12 noon.   05/28/2018 at Unknown time    Allergies  Allergen Reactions  . Cefzil [Cefprozil] Rash    Review of Systems: Negative except for  what is mentioned in HPI.  Physical Exam: BP 132/87   Pulse 81   Temp 98 F (36.7 C) (Oral)   Resp 16   Ht 5' (1.524 m)   Wt 81.6 kg   LMP 08/28/2017   BMI 35.15 kg/m  GENERAL: Well-developed, well-nourished female in no acute distress.  LUNGS: Clear to auscultation bilaterally.  HEART: Regular rate and rhythm. ABDOMEN: Soft, nontender, nondistended, gravid. EXTREMITIES: Nontender, no edema, 2+ distal pulses. Cervical Exam:   FHT:  Baseline rate 155 bpm   Variability moderate  Accelerations present   Decelerations variable Contractions: Every 6-9 mins   Pertinent Labs/Studies:   Results for orders placed or performed during the hospital encounter of 05/28/18 (from the past 24 hour(s))  CBC     Status: Abnormal   Collection Time: 05/28/18  5:49 AM  Result  Value Ref Range   WBC 12.3 (H) 4.0 - 10.5 K/uL   RBC 3.64 (L) 3.87 - 5.11 MIL/uL   Hemoglobin 11.1 (L) 12.0 - 15.0 g/dL   HCT 16.9 (L) 67.8 - 93.8 %   MCV 90.9 80.0 - 100.0 fL   MCH 30.5 26.0 - 34.0 pg   MCHC 33.5 30.0 - 36.0 g/dL   RDW 10.1 75.1 - 02.5 %   Platelets 300 150 - 400 K/uL   nRBC 0.0 0.0 - 0.2 %  Type and screen     Status: None (Preliminary result)   Collection Time: 05/28/18  5:49 AM  Result Value Ref Range   ABO/RH(D) PENDING    Antibody Screen PENDING    Sample Expiration      05/31/2018,2359 Performed at [facility], [address], [place], [place] [address]     Assessment : Sarah Blankenship is a 23 y.o. G1P0000 at [redacted]w[redacted]d being admitted for induction of labor.will repeat PIH labs.  Plan: Labor:  Induction as needed, per protocol FWB: Reassuring fetal heart tracing.  GBS positive Delivery plan: Hopeful for vaginal delivery  Rachele Lamaster, CNM [facility], [facility]

## 2018-05-28 NOTE — Anesthesia Preprocedure Evaluation (Signed)
Anesthesia Evaluation  Patient identified by MRN, date of birth, ID band Patient awake    Reviewed: Allergy & Precautions, NPO status , Patient's Chart, lab work & pertinent test results, reviewed documented beta blocker date and time   Airway Mallampati: II  TM Distance: >3 FB     Dental  (+) Chipped   Pulmonary           Cardiovascular hypertension,      Neuro/Psych    GI/Hepatic   Endo/Other    Renal/GU      Musculoskeletal   Abdominal   Peds  Hematology  (+) anemia ,   Anesthesia Other Findings   Reproductive/Obstetrics                             Anesthesia Physical Anesthesia Plan  ASA: II  Anesthesia Plan: Epidural   Post-op Pain Management:    Induction:   PONV Risk Score and Plan:   Airway Management Planned:   Additional Equipment:   Intra-op Plan:   Post-operative Plan:   Informed Consent: I have reviewed the patients History and Physical, chart, labs and discussed the procedure including the risks, benefits and alternatives for the proposed anesthesia with the patient or authorized representative who has indicated his/her understanding and acceptance.       Plan Discussed with: CRNA  Anesthesia Plan Comments:         Anesthesia Quick Evaluation

## 2018-05-28 NOTE — Progress Notes (Signed)
Sarah Blankenship is a 23 y.o. G1P0000 at [redacted]w[redacted]d by LMP admitted for induction of labor due to Low amniotic fluid..  Subjective: Patient reported increased pain/pressure with contractions, relieved some with IV fentanyl  Objective: BP 107/69   Pulse 69   Temp 98.3 F (36.8 C) (Oral)   Resp 15   Ht 5' (1.524 m)   Wt 81.6 kg   LMP 08/28/2017   BMI 35.15 kg/m  No intake/output data recorded. Total I/O In: 1804.7 [I.V.:1604.7; IV Piggyback:200] Out: -   FHT:  FHR: 148 bpm, variability: minimal ,  accelerations:  Present,  decelerations:  Present early  UC:   regular, every 3-4 minutes, on 2 mu/min pitocin, and noted SVE:   Dilation: 3.5 Effacement (%): 80 Station: -2 Exam by:: [employer]: Lab Results  Component Value Date   WBC 12.3 (H) 05/28/2018   HGB 11.1 (L) 05/28/2018   HCT 33.1 (L) 05/28/2018   MCV 90.9 05/28/2018   PLT 300 05/28/2018    Assessment / Plan: IOL, responded well to amnioinfusion  Labor: Progressing normally Preeclampsia:  labs stable Fetal Wellbeing:  Category I Pain Control:  IV pain meds I/D:  n/a Anticipated MOD:  NSVD  Noretta Frier N Diamond Jentz 05/28/2018, 1:31 PM

## 2018-05-28 NOTE — Anesthesia Procedure Notes (Signed)
Epidural Patient location during procedure: OB  Staffing Anesthesiologist: Geeta Dworkin, MD Performed: anesthesiologist   Preanesthetic Checklist Completed: patient identified, site marked, surgical consent, pre-op evaluation, timeout performed, IV checked, risks and benefits discussed and monitors and equipment checked  Epidural Patient position: sitting Prep: ChloraPrep Patient monitoring: heart rate, continuous pulse ox and blood pressure Approach: midline Location: L4-L5 Injection technique: LOR saline  Needle:  Needle type: Tuohy  Needle gauge: 18 G Needle length: 9 cm and 9 Catheter type: closed end flexible Catheter size: 20 Guage Test dose: negative and 1.5% lidocaine with Epi 1:200 K  Assessment Sensory level: T10 Events: blood not aspirated, injection not painful, no injection resistance, negative IV test and no paresthesia  Additional Notes   Patient tolerated the insertion well without complications.Reason for block:procedure for pain     

## 2018-05-29 ENCOUNTER — Encounter: Payer: 59 | Admitting: Certified Nurse Midwife

## 2018-05-29 LAB — CBC
HCT: 32.8 % — ABNORMAL LOW (ref 36.0–46.0)
Hemoglobin: 10.8 g/dL — ABNORMAL LOW (ref 12.0–15.0)
MCH: 30.3 pg (ref 26.0–34.0)
MCHC: 32.9 g/dL (ref 30.0–36.0)
MCV: 91.9 fL (ref 80.0–100.0)
Platelets: 315 10*3/uL (ref 150–400)
RBC: 3.57 MIL/uL — ABNORMAL LOW (ref 3.87–5.11)
RDW: 14.6 % (ref 11.5–15.5)
WBC: 20.1 10*3/uL — ABNORMAL HIGH (ref 4.0–10.5)
nRBC: 0 % (ref 0.0–0.2)

## 2018-05-29 LAB — RPR: RPR Ser Ql: NONREACTIVE

## 2018-05-29 MED ORDER — FERROUS SULFATE 325 (65 FE) MG PO TABS
325.0000 mg | ORAL_TABLET | Freq: Every day | ORAL | Status: DC
  Administered 2018-05-30: 325 mg via ORAL
  Filled 2018-05-29: qty 1

## 2018-05-29 MED ORDER — MAGNESIUM OXIDE 400 (241.3 MG) MG PO TABS
400.0000 mg | ORAL_TABLET | Freq: Every day | ORAL | Status: DC
  Administered 2018-05-29 – 2018-05-30 (×2): 400 mg via ORAL
  Filled 2018-05-29 (×2): qty 1

## 2018-05-29 NOTE — Anesthesia Postprocedure Evaluation (Signed)
Anesthesia Post Note  Patient: Sarah Blankenship  Procedure(s) Performed: AN AD HOC LABOR EPIDURAL  Patient location during evaluation: Mother Baby Anesthesia Type: Epidural Level of consciousness: oriented and awake and alert Pain management: pain level controlled Vital Signs Assessment: post-procedure vital signs reviewed and stable Respiratory status: spontaneous breathing and respiratory function stable Cardiovascular status: blood pressure returned to baseline and stable Postop Assessment: no headache, no backache, no apparent nausea or vomiting and able to ambulate Anesthetic complications: no     Last Vitals:  Vitals:   05/29/18 0125 05/29/18 0735  BP: 116/73 102/63  Pulse: 86 79  Resp: 18 20  Temp: 36.7 C 36.7 C  SpO2: 96% 98%    Last Pain:  Vitals:   05/29/18 0735  TempSrc: Oral  PainSc:                  Starling Manns

## 2018-05-29 NOTE — Lactation Note (Signed)
This note was copied from a baby's chart. Lactation Consultation Note  Patient Name: Sarah Blankenship Date: 05/29/2018     Maternal Data    Feeding    LATCH Score                   Interventions    Lactation Tools Discussed/Used     Consult Status  Mother states that she is pumping every 2-3 hours and denies any concerns with pumping at the moment. LC encouraged mother to also pump at infant's beside in SCN.     Sarah Blankenship 05/29/2018, 12:21 PM

## 2018-05-29 NOTE — Progress Notes (Signed)
Patient ID: Sarah Blankenship, female   DOB: Mar 18, 1995, 23 y.o.   MRN: [id number]  Post Partum Day # 1, s/p induced vaginal birth for oligohydramnios at 39 weeks, Rh positive, gestational hypertension, blood loss anemia, breastfeeding  Subjective:  Patient in special care nursery visiting infant. No questions or concerns at this time.   Denies difficulty breathing or respiratory distress, chest pain, abdominal pain, excessive vaginal bleeding, dysuria, and leg pain or swelling.   Objective: Temp:  [98 F (36.7 C)-98.3 F (36.8 C)] 98 F (36.7 C) (05/22 0735) Pulse Rate:  [69-101] 79 (05/22 0735) Resp:  [18-20] 20 (05/22 0735) BP: (94-137)/(63-86) 102/63 (05/22 0735) SpO2:  [91 %-99 %] 98 % (05/22 0735)  Physical Exam:   General: alert and cooperative   Lungs: clear to auscultation bilaterally  Heart: normal apical impulse  Limited assessment due to patient visiting infant in SCN at this time.   Recent Labs    05/28/18 0549 05/29/18 0721  HGB 11.1* 10.8*  HCT 33.1* 32.8*    Assessment:  23 year old, G1P1 s/p induced vaginal birth for oligohydramnios at 39 weeks, Rh positive, gestational hypertension, blood loss anemia, breastfeeding  Infant in special care nursery  Plan:  Rx Iron and Magnesium, see orders.   Reviewed red flag symptoms and when to call.   Lactation consult as needed.   Continue orders as written. Reassess as needed.   Anticipate discharge tomorrow.    LOS: 1 day   Sarah Blankenship, CNM [facility], [facility] 05/29/2018 12:30 PM

## 2018-05-30 DIAGNOSIS — O4100X0 Oligohydramnios, unspecified trimester, not applicable or unspecified: Secondary | ICD-10-CM | POA: Diagnosis present

## 2018-05-30 MED ORDER — MAGNESIUM OXIDE 400 (241.3 MG) MG PO TABS: 400.0000 mg | ORAL_TABLET | Freq: Every day | ORAL | 3 refills | Status: DC

## 2018-05-30 MED ORDER — IBUPROFEN 600 MG PO TABS: 600.0000 mg | ORAL_TABLET | Freq: Four times a day (QID) | ORAL | 0 refills | Status: DC

## 2018-05-30 MED ORDER — FERROUS SULFATE 325 (65 FE) MG PO TABS: 325.0000 mg | ORAL_TABLET | Freq: Every day | ORAL | 3 refills | Status: DC

## 2018-05-30 NOTE — Progress Notes (Signed)
Reviewed D/C instructions with pt and family. Pt verbalized understanding of teaching. Discharged to home via W/C. Pt to schedule f/u appt.  

## 2018-05-30 NOTE — Discharge Summary (Signed)
  Obstetric Discharge Summary  Patient ID: Sarah Blankenship MRN: [id number] DOB/AGE: 31-Mar-1995 23 y.o.   Date of Admission: 05/28/2018  Date of Discharge:  05/30/18  Admitting Diagnosis: Induction of labor at [redacted]w[redacted]d  Secondary Diagnosis: Gestational hypertension and Oligohydramnios  Mode of Delivery: Normal spontaneous vaginal delivery     Discharge Diagnosis: No other diagnosis   Intrapartum Procedures: Atificial rupture of membranes, epidural, GBS prophylaxis and pitocin augmentation   Post partum procedures: None  Complications: Second degree perineal laceration, repaired   Brief Hospital Course   Sarah Blankenship is a G1P0000 who had a SVD on 05/28/2018;  for further details of this birth, please refer to the delivey summary.  Patient had an uncomplicated postpartum course.  By time of discharge on PPD#2, her pain was controlled on oral pain medications; she had appropriate lochia and was ambulating, voiding without difficulty and tolerating regular diet.  She was deemed stable for discharge to home.    Labs:  CBC Latest Ref Rng & Units 05/29/2018 05/28/2018 05/22/2018  WBC 4.0 - 10.5 K/uL 20.1(H) 12.3(H) 12.4(H)  Hemoglobin 12.0 - 15.0 g/dL 10.8(L) 11.1(L) 11.2(L)  Hematocrit 36.0 - 46.0 % 32.8(L) 33.1(L) 33.2(L)  Platelets 150 - 400 K/uL 315 300 316   AB POS  Physical exam:   Temp:  [97.8 F (36.6 C)-98.2 F (36.8 C)] 98.2 F (36.8 C) (05/23 0739) Pulse Rate:  [66-80] 66 (05/23 0739) Resp:  [18] 18 (05/23 0739) BP: (123-124)/(75-82) 123/82 (05/23 0739) SpO2:  [96 %-99 %] 96 % (05/23 0739)  General: alert and no distress  Lochia: appropriate  Abdomen: soft, NT  Uterine Fundus: firm  Perineum: healing well, no significant drainage, no dehiscence, no significant erythema  Extremities: No evidence of DVT seen on physical exam. Generalized lower extremity edema.  Discharge Instructions: Per After Visit Summary.  Activity: Advance as tolerated. Pelvic rest  for 6 weeks.  Also refer to After Visit Summary  Diet: Regular  Medications: Allergies as of 05/30/2018      Reactions   Cefzil [cefprozil] Rash      Medication List    TAKE these medications   cetirizine 10 MG tablet Commonly known as:  ZYRTEC Take 10 mg by mouth daily.   ferrous sulfate 325 (65 FE) MG tablet Take 1 tablet (325 mg total) by mouth daily with breakfast.   ibuprofen 600 MG tablet Commonly known as:  ADVIL Take 1 tablet (600 mg total) by mouth every 6 (six) hours.   magnesium oxide 400 (241.3 Mg) MG tablet Commonly known as:  MAG-OX Take 1 tablet (400 mg total) by mouth daily.   prenatal multivitamin Tabs tablet Take 1 tablet by mouth daily at 12 noon.      Outpatient follow up:  Follow-up Information    [place], Melodye Ped, CNM. Schedule an appointment as soon as possible for a visit in 6 week(s).   Specialties:  Obstetrics and Gynecology, Radiology Why:  Please call to schedule a six (6) week postpartum visit with Melody Contact information: [address] [place] [address] [phone]          Postpartum contraception: IUD, Mirena  Discharged Condition: stable  Discharged to: home   Newborn Data:  Disposition:SCN  Apgars: APGAR (1 MIN): 8   APGAR (5 MINS): 9    Baby Feeding: Bottle and Breast   Gunnar Bulla, CNM [facility], [facility] 05/30/18 8:20 AM

## 2018-05-30 NOTE — Discharge Instructions (Signed)
Levonorgestrel intrauterine device (IUD) °What is this medicine? °LEVONORGESTREL IUD (LEE voe nor jes trel) is a contraceptive (birth control) device. The device is placed inside the uterus by a healthcare professional. It is used to prevent pregnancy. This device can also be used to treat heavy bleeding that occurs during your period. °This medicine may be used for other purposes; ask your health care provider or pharmacist if you have questions. °COMMON BRAND NAME(S): Kyleena, LILETTA, Mirena, Skyla °What should I tell my health care provider before I take this medicine? °They need to know if you have any of these conditions: °-abnormal Pap smear °-cancer of the breast, uterus, or cervix °-diabetes °-endometritis °-genital or pelvic infection now or in the past °-have more than one sexual partner or your partner has more than one partner °-heart disease °-history of an ectopic or tubal pregnancy °-immune system problems °-IUD in place °-liver disease or tumor °-problems with blood clots or take blood-thinners °-seizures °-use intravenous drugs °-uterus of unusual shape °-vaginal bleeding that has not been explained °-an unusual or allergic reaction to levonorgestrel, other hormones, silicone, or polyethylene, medicines, foods, dyes, or preservatives °-pregnant or trying to get pregnant °-breast-feeding °How should I use this medicine? °This device is placed inside the uterus by a health care professional. °Talk to your pediatrician regarding the use of this medicine in children. Special care may be needed. °Overdosage: If you think you have taken too much of this medicine contact a poison control center or emergency room at once. °NOTE: This medicine is only for you. Do not share this medicine with others. °What if I miss a dose? °This does not apply. Depending on the brand of device you have inserted, the device will need to be replaced every 3 to 5 years if you wish to continue using this type of birth  control. °What may interact with this medicine? °Do not take this medicine with any of the following medications: °-amprenavir °-bosentan °-fosamprenavir °This medicine may also interact with the following medications: °-aprepitant °-armodafinil °-barbiturate medicines for inducing sleep or treating seizures °-bexarotene °-boceprevir °-griseofulvin °-medicines to treat seizures like carbamazepine, ethotoin, felbamate, oxcarbazepine, phenytoin, topiramate °-modafinil °-pioglitazone °-rifabutin °-rifampin °-rifapentine °-some medicines to treat HIV infection like atazanavir, efavirenz, indinavir, lopinavir, nelfinavir, tipranavir, ritonavir °-St. John's wort °-warfarin °This list may not describe all possible interactions. Give your health care provider a list of all the medicines, herbs, non-prescription drugs, or dietary supplements you use. Also tell them if you smoke, drink alcohol, or use illegal drugs. Some items may interact with your medicine. °What should I watch for while using this medicine? °Visit your doctor or health care professional for regular check ups. See your doctor if you or your partner has sexual contact with others, becomes HIV positive, or gets a sexual transmitted disease. °This product does not protect you against HIV infection (AIDS) or other sexually transmitted diseases. °You can check the placement of the IUD yourself by reaching up to the top of your vagina with clean fingers to feel the threads. Do not pull on the threads. It is a good habit to check placement after each menstrual period. Call your doctor right away if you feel more of the IUD than just the threads or if you cannot feel the threads at all. °The IUD may come out by itself. You may become pregnant if the device comes out. If you notice that the IUD has come out use a backup birth control method like condoms and call your   health care provider. Using tampons will not change the position of the IUD and are okay to use  during your period. This IUD can be safely scanned with magnetic resonance imaging (MRI) only under specific conditions. Before you have an MRI, tell your healthcare provider that you have an IUD in place, and which type of IUD you have in place. What side effects may I notice from receiving this medicine? Side effects that you should report to your doctor or health care professional as soon as possible: -allergic reactions like skin rash, itching or hives, swelling of the face, lips, or tongue -fever, flu-like symptoms -genital sores -high blood pressure -no menstrual period for 6 weeks during use -pain, swelling, warmth in the leg -pelvic pain or tenderness -severe or sudden headache -signs of pregnancy -stomach cramping -sudden shortness of breath -trouble with balance, talking, or walking -unusual vaginal bleeding, discharge -yellowing of the eyes or skin Side effects that usually do not require medical attention (report to your doctor or health care professional if they continue or are bothersome): -acne -breast pain -change in sex drive or performance -changes in weight -cramping, dizziness, or faintness while the device is being inserted -headache -irregular menstrual bleeding within first 3 to 6 months of use -nausea This list may not describe all possible side effects. Call your doctor for medical advice about side effects. You may report side effects to FDA at 1-800-FDA-1088. Where should I keep my medicine? This does not apply. NOTE: This sheet is a summary. It may not cover all possible information. If you have questions about this medicine, talk to your doctor, pharmacist, or health care provider.  2019 Elsevier/Gold Standard (2015-10-06 14:14:56) Breast Pumping Tips Breast pumping is a way to get milk out of your breasts. You will then store the milk for your baby to use when you are away from home. There are three ways to pump. You can:  Use your hand to massage and  squeeze your breast (hand expression).  Use a hand-held machine to manually pump your milk.  Use an electric machine to pump your milk. In the beginning you may not get much milk. After a few days your breasts should make more. Pumping can help you start making milk after your baby is born. Pumping helps you to keep making milk when you are away from your baby. When should I pump? You can start pumping soon after your baby is born. Follow these tips:  When you are with your baby: ? Pump after you breastfeed. ? Pump from the free breast while you breastfeed.  When you are away from your baby: ? Pump every 2-3 hours for 15 minutes. ? Pump both breasts at the same time if you can.  If your baby drinks formula, pump around the time your baby gets the formula.  If you drank alcohol, wait 2 hours before you pump.  If you are going to have surgery, ask your doctor when you should pump again. How do I get ready to pump? Take steps to relax. Try these things to help your milk come in:  Smell your baby's blanket or clothes.  Look at a picture or video of your baby.  Sit in a quiet, private space.  Massage your breast and nipple.  Place a cloth on your breast. The cloth should be warm and a little wet.  Play relaxing music.  Picture your milk flowing. What are some tips? General tips for pumping breast milk  Always wash  your hands before pumping.  If you do not get much milk or if pumping hurts, try different pump settings or a different kind of pump.  Drink enough fluid so your pee (urine) is clear or pale yellow.  Wear clothing that opens in the front or is easy to take off.  Pump milk into a clean bottle or container.  Do not use anything that has nicotine or tobacco. Examples are cigarettes and e-cigarettes. If you need help quitting, ask your doctor. Tips for storing breast milk  Store breast milk in a clean, BPA-free container. These include: ? A glass or plastic  bottle. ? A milk storage bag.  Store only 2-4 ounces of breast milk in each container.  Swirl the breast milk in the container. Do not shake it.  Write down the date you pumped the milk on the container.  This is how long you can store breast milk: ? Room temperature: 6-8 hours. It is best to use the milk within 4 hours. ? Cooler with ice packs: 24 hours. ? Refrigerator: 5-8 days, if the milk is clean. It is best to use the milk within 3 days. ? Freezer: 9-12 months, if the milk is clean and stored away from the freezer door. It is best to use the milk within 6 months.  Put milk in the back of the refrigerator or freezer.  Thaw frozen milk using warm water. Do not use the microwave. Tips for choosing a breast pump When choosing a pump, keep the following things in mind:  Manual breast pumps do not need electricity. They cost less. They can be hard to use.  Electric breast pumps use electricity. They are more expensive. They are easier to use. They collect more milk.  The suction cup (flange) should be the right size.  Before you buy the pump, check if your insurance will pay for it. Tips for caring for a breast pump  Check the manual that came with your pump for cleaning tips.  Clean the pump after you use it. To do this: 1. Wipe down the electrical part. Use a dry cloth or paper towel. Do not put this part in water or in cleaning products. 2. Wash the plastic parts with soap and warm water. Or use the dishwasher if the manual says it is safe. You do not need to clean the tubing unless it touched breast milk. 3. Let all the parts air dry. Avoid drying them with a cloth or towel. 4. When the parts are clean and dry, put the pump back together. Then store the pump.  If there is water in the tubing when you want to pump: 1. Attach the tubing to the pump. 2. Turn on the pump. 3. Turn off the pump when the tube is dry.  Try not to touch the inside of pump  parts. Summary  Pumping can help you start making milk after your baby is born. It lets you keep making milk when you are away from your baby.  When you are away from your baby, pump for about 15 minutes every 2-3 hours. Pump both breasts at the same time, if you can. This information is not intended to replace advice given to you by your health care provider. Make sure you discuss any questions you have with your health care provider. Document Released: 06/12/2007 Document Revised: 01/29/2016 Document Reviewed: 01/29/2016 Elsevier Interactive Patient Education  2019 [employer]. Home Care Instructions for Mom Activity  Gradually return to your  regular activities.  Let yourself rest. Nap while your baby sleeps.  Avoid lifting anything that is heavier than 10 lb (4.5 kg) until your health care provider says it is okay.  Avoid activities that take a lot of effort and energy (are strenuous) until approved by your health care provider. Walking at a slow-to-moderate pace is usually safe.  If you had a cesarean delivery: ? Do not vacuum, climb stairs, or drive a car for 4-6 weeks. ? Have someone help you at home until you feel like you can do your usual activities yourself. ? Do exercises as told by your health care provider, if this applies. Vaginal bleeding You may continue to bleed for 4-6 weeks after delivery. Over time, the amount of blood usually decreases and the color of the blood usually gets lighter. However, the flow of bright red blood may increase if you have been too active. If you need to use more than one pad in an hour because your pad gets soaked, or if you pass a large clot:  Lie down.  Raise your feet.  Place a cold compress on your lower abdomen.  Rest.  Call your health care provider. If you are breastfeeding, your period should return anytime between 8 weeks after delivery and the time that you stop breastfeeding. If you are not breastfeeding, your period should  return 6-8 weeks after delivery. Perineal care The perineal area, or perineum, is the part of your body between your thighs. After delivery, this area needs special care. Follow these instructions as told by your health care provider.  Take warm tub baths for 15-20 minutes.  Use medicated pads and pain-relieving sprays and creams as told.  Do not use tampons or douches until vaginal bleeding has stopped.  Each time you go to the bathroom: ? Use a peri bottle. ? Change your pad. ? Use towelettes in place of toilet paper until your stitches have healed.  Do Kegel exercises every day. Kegel exercises help to maintain the muscles that support the vagina, bladder, and bowels. You can do these exercises while you are standing, sitting, or lying down. To do Kegel exercises: ? Tighten the muscles of your abdomen and the muscles that surround your birth canal. ? Hold for a few seconds. ? Relax. ? Repeat until you have done this 5 times in a row.  To prevent hemorrhoids from developing or getting worse: ? Drink enough fluid to keep your urine clear or pale yellow. ? Avoid straining when having a bowel movement. ? Take over-the-counter medicines and stool softeners as told by your health care provider. Breast care  Wear a tight-fitting bra.  Avoid taking over-the-counter pain medicine for breast discomfort.  Apply ice to the breasts to help with discomfort as needed: ? Put ice in a plastic bag. ? Place a towel between your skin and the bag. ? Leave the ice on for 20 minutes or as told by your health care provider. Nutrition  Eat a well-balanced diet.  Do not try to lose weight quickly by cutting back on calories.  Take your prenatal vitamins until your postpartum checkup or until your health care provider tells you to stop. Postpartum depression You may find yourself crying for no apparent reason and unable to cope with all of the changes that come with having a newborn. This mood is  called postpartum depression. Postpartum depression happens because your hormone levels change after delivery. If you have postpartum depression, get support from your partner, friends,  and family. If the depression does not go away on its own after several weeks, contact your health care provider. Breast self-exam  Do a breast self-exam each month, at the same time of the month. If you are breastfeeding, check your breasts just after a feeding, when your breasts are less full. If you are breastfeeding and your period has started, check your breasts on day 5, 6, or 7 of your period. Report any lumps, bumps, or discharge to your health care provider. Know that breasts are normally lumpy if you are breastfeeding. This is temporary, and it is not a health risk. Intimacy and sexuality Avoid sexual activity for at least 3-4 weeks after delivery or until the brownish-red vaginal flow is completely gone. If you want to avoid pregnancy, use some form of birth control. You can get pregnant after delivery, even if you have not had your period. Contact a health care provider if:  You feel unable to cope with the changes that a child brings to your life, and these feelings do not go away after several weeks.  You notice a lump, a bump, or discharge on your breast. Get help right away if:  Blood soaks your pad in 1 hour or less.  You have: ? Severe pain or cramping in your lower abdomen. ? A bad-smelling vaginal discharge. ? A fever that is not controlled by medicine. ? A fever, and an area of your breast is red and sore. ? Pain or redness in your calf. ? Sudden, severe chest pain. ? Shortness of breath. ? Painful or bloody urination. ? Problems with your vision. ? You vomit for 12 hours or longer. ? You develop a severe headache. ? You have serious thoughts about hurting yourself, your child, or anyone else. This information is not intended to replace advice given to you by your health care  provider. Make sure you discuss any questions you have with your health care provider. Document Released: 12/22/1999 Document Revised: 02/19/2017 Document Reviewed: 06/27/2014 Elsevier Interactive Patient Education  2019 [employer] Postpartum Care After Vaginal Delivery This sheet gives you information about how to care for yourself from the time you deliver your baby to up to 6-12 weeks after delivery (postpartum period). Your health care provider may also give you more specific instructions. If you have problems or questions, contact your health care provider. Follow these instructions at home: Vaginal bleeding  It is normal to have vaginal bleeding (lochia) after delivery. Wear a sanitary pad for vaginal bleeding and discharge. ? During the first week after delivery, the amount and appearance of lochia is often similar to a menstrual period. ? Over the next few weeks, it will gradually decrease to a dry, yellow-brown discharge. ? For most women, lochia stops completely by 4-6 weeks after delivery. Vaginal bleeding can vary from woman to woman.  Change your sanitary pads frequently. Watch for any changes in your flow, such as: ? A sudden increase in volume. ? A change in color. ? Large blood clots.  If you pass a blood clot from your vagina, save it and call your health care provider to discuss. Do not flush blood clots down the toilet before talking with your health care provider.  Do not use tampons or douches until your health care provider says this is safe.  If you are not breastfeeding, your period should return 6-8 weeks after delivery. If you are feeding your child breast milk only (exclusive breastfeeding), your period may not return until you  stop breastfeeding. Perineal care  Keep the area between the vagina and the anus (perineum) clean and dry as told by your health care provider. Use medicated pads and pain-relieving sprays and creams as directed.  If you had a cut in  the perineum (episiotomy) or a tear in the vagina, check the area for signs of infection until you are healed. Check for: ? More redness, swelling, or pain. ? Fluid or blood coming from the cut or tear. ? Warmth. ? Pus or a bad smell.  You may be given a squirt bottle to use instead of wiping to clean the perineum area after you go to the bathroom. As you start healing, you may use the squirt bottle before wiping yourself. Make sure to wipe gently.  To relieve pain caused by an episiotomy, a tear in the vagina, or swollen veins in the anus (hemorrhoids), try taking a warm sitz bath 2-3 times a day. A sitz bath is a warm water bath that is taken while you are sitting down. The water should only come up to your hips and should cover your buttocks. Breast care  Within the first few days after delivery, your breasts may feel heavy, full, and uncomfortable (breast engorgement). Milk may also leak from your breasts. Your health care provider can suggest ways to help relieve the discomfort. Breast engorgement should go away within a few days.  If you are breastfeeding: ? Wear a bra that supports your breasts and fits you well. ? Keep your nipples clean and dry. Apply creams and ointments as told by your health care provider. ? You may need to use breast pads to absorb milk that leaks from your breasts. ? You may have uterine contractions every time you breastfeed for up to several weeks after delivery. Uterine contractions help your uterus return to its normal size. ? If you have any problems with breastfeeding, work with your health care provider or Advertising copywriter.  If you are not breastfeeding: ? Avoid touching your breasts a lot. Doing this can make your breasts produce more milk. ? Wear a good-fitting bra and use cold packs to help with swelling. ? Do not squeeze out (express) milk. This causes you to make more milk. Intimacy and sexuality  Ask your health care provider when you can  engage in sexual activity. This may depend on: ? Your risk of infection. ? How fast you are healing. ? Your comfort and desire to engage in sexual activity.  You are able to get pregnant after delivery, even if you have not had your period. If desired, talk with your health care provider about methods of birth control (contraception). Medicines  Take over-the-counter and prescription medicines only as told by your health care provider.  If you were prescribed an antibiotic medicine, take it as told by your health care provider. Do not stop taking the antibiotic even if you start to feel better. Activity  Gradually return to your normal activities as told by your health care provider. Ask your health care provider what activities are safe for you.  Rest as much as possible. Try to rest or take a nap while your baby is sleeping. Eating and drinking   Drink enough fluid to keep your urine pale yellow.  Eat high-fiber foods every day. These may help prevent or relieve constipation. High-fiber foods include: ? Whole grain cereals and breads. ? Brown rice. ? Beans. ? Fresh fruits and vegetables.  Do not try to lose weight quickly by  cutting back on calories.  Take your prenatal vitamins until your postpartum checkup or until your health care provider tells you it is okay to stop. Lifestyle  Do not use any products that contain nicotine or tobacco, such as cigarettes and e-cigarettes. If you need help quitting, ask your health care provider.  Do not drink alcohol, especially if you are breastfeeding. General instructions  Keep all follow-up visits for you and your baby as told by your health care provider. Most women visit their health care provider for a postpartum checkup within the first 3-6 weeks after delivery. Contact a health care provider if:  You feel unable to cope with the changes that your child brings to your life, and these feelings do not go away.  You feel unusually  sad or worried.  Your breasts become red, painful, or hard.  You have a fever.  You have trouble holding urine or keeping urine from leaking.  You have little or no interest in activities you used to enjoy.  You have not breastfed at all and you have not had a menstrual period for 12 weeks after delivery.  You have stopped breastfeeding and you have not had a menstrual period for 12 weeks after you stopped breastfeeding.  You have questions about caring for yourself or your baby.  You pass a blood clot from your vagina. Get help right away if:  You have chest pain.  You have difficulty breathing.  You have sudden, severe leg pain.  You have severe pain or cramping in your lower abdomen.  You bleed from your vagina so much that you fill more than one sanitary pad in one hour. Bleeding should not be heavier than your heaviest period.  You develop a severe headache.  You faint.  You have blurred vision or spots in your vision.  You have bad-smelling vaginal discharge.  You have thoughts about hurting yourself or your baby. If you ever feel like you may hurt yourself or others, or have thoughts about taking your own life, get help right away. You can go to the nearest emergency department or call:  Your local emergency services (911 in the [place]  A suicide crisis helpline, such as the National Suicide Prevention Lifeline at 6107797014. This is open 24 hours a day. Summary  The period of time right after you deliver your newborn up to 6-12 weeks after delivery is called the postpartum period.  Gradually return to your normal activities as told by your health care provider.  Keep all follow-up visits for you and your baby as told by your health care provider. This information is not intended to replace advice given to you by your health care provider. Make sure you discuss any questions you have with your health care provider. Document Released: 10/21/2006 Document  Revised: 10/07/2016 Document Reviewed: 10/07/2016 Elsevier Interactive Patient Education  2019 [employer]. Postpartum Baby Blues The postpartum period begins right after the birth of a baby. During this time, there is often a lot of joy and excitement. It is also a time of many changes in the life of the parents. No matter how many times a mother gives birth, each child brings new challenges to the family, including different ways of relating to one another. It is common to have feelings of excitement along with confusing changes in moods, emotions, and thoughts. You may feel happy one minute and sad or stressed the next. These feelings of sadness usually happen in the period right after you  have your baby, and they go away within a week or two. This is called the "baby blues." What are the causes? There is no known cause of baby blues. It is likely caused by a combination of factors. However, changes in hormone levels after childbirth are believed to trigger some of the symptoms. Other factors that can play a role in these mood changes include:  Lack of sleep.  Stressful life events, such as poverty, caring for a loved one, or death of a loved one.  Genetics. What are the signs or symptoms? Symptoms of this condition include:  Brief changes in mood, such as going from extreme happiness to sadness.  Decreased concentration.  Difficulty sleeping.  Crying spells and tearfulness.  Loss of appetite.  Irritability.  Anxiety. If the symptoms of baby blues last for more than 2 weeks or become more severe, you may have postpartum depression. How is this diagnosed? This condition is diagnosed based on an evaluation of your symptoms. There are no medical or lab tests that lead to a diagnosis, but there are various questionnaires that a health care provider may use to identify women with the baby blues or postpartum depression. How is this treated? Treatment is not needed for this  condition. The baby blues usually go away on their own in 1-2 weeks. Social support is often all that is needed. You will be encouraged to get adequate sleep and rest. Follow these instructions at home: Lifestyle      Get as much rest as you can. Take a nap when the baby sleeps.  Exercise regularly as told by your health care provider. Some women find yoga and walking to be helpful.  Eat a balanced and nourishing diet. This includes plenty of fruits and vegetables, whole grains, and lean proteins.  Do little things that you enjoy. Have a cup of tea, take a bubble bath, read your favorite magazine, or listen to your favorite music.  Avoid alcohol.  Ask for help with household chores, cooking, grocery shopping, or running errands. Do not try to do everything yourself. Consider hiring a postpartum doula to help. This is a professional who specializes in providing support to new mothers.  Try not to make any major life changes during pregnancy or right after giving birth. This can add stress. General instructions  Talk to people close to you about how you are feeling. Get support from your partner, family members, friends, or other new moms. You may want to join a support group.  Find ways to cope with stress. This may include: ? Writing your thoughts and feelings in a journal. ? Spending time outside. ? Spending time with people who make you laugh.  Try to stay positive in how you think. Think about the things you are grateful for.  Take over-the-counter and prescription medicines only as told by your health care provider.  Let your health care provider know if you have any concerns.  Keep all postpartum visits as told by your health care provider. This is important. Contact a health care provider if:  Your baby blues do not go away after 2 weeks. Get help right away if:  You have thoughts of taking your own life (suicidal thoughts).  You think you may harm the baby or other  people.  You see or hear things that are not there (hallucinations). Summary  After giving birth, you may feel happy one minute and sad or stressed the next. Feelings of sadness that happen right after  the baby is born and go away after a week or two are called the "baby blues."  You can manage the baby blues by getting enough rest, eating a healthy diet, exercising, spending time with supportive people, and finding ways to cope with stress.  If feelings of sadness and stress last longer than 2 weeks or get in the way of caring for your baby, talk to your health care provider. This may mean you have postpartum depression. This information is not intended to replace advice given to you by your health care provider. Make sure you discuss any questions you have with your health care provider. Document Released: 09/28/2003 Document Revised: 02/20/2016 Document Reviewed: 02/20/2016 Elsevier Interactive Patient Education  2019 [employer].

## 2018-06-04 ENCOUNTER — Other Ambulatory Visit: Payer: Self-pay

## 2018-06-04 ENCOUNTER — Ambulatory Visit: Payer: Self-pay

## 2018-06-04 ENCOUNTER — Inpatient Hospital Stay: Admit: 2018-06-04 | Payer: Self-pay

## 2018-06-04 MED ORDER — DICLOXACILLIN SODIUM 500 MG PO CAPS: 500.0000 mg | ORAL_CAPSULE | Freq: Four times a day (QID) | ORAL | 0 refills | Status: DC

## 2018-06-04 NOTE — Lactation Note (Signed)
This note was copied from a baby's chart. Lactation Consultation Note  Patient Name: Sarah Blankenship IRWER'X Date: 06/04/2018 Reason for consult: Follow-up assessment Mother is being treated for mastitis and wants to wean. Maternal Data  mother is 7 days post delivery. She does not have flue like symptoms. She seems to have engorgement.  Feeding Feeding Type: Formula  LATCH Score                   Interventions   Cool packs to breast and frequent pumping. Eventuallyt after Antibiotics mother can wean. Lactation Tools Discussed/Used     Consult Status      Trudee Grip 06/04/2018, 1:18 PM

## 2018-06-05 ENCOUNTER — Ambulatory Visit: Payer: Self-pay

## 2018-06-05 NOTE — Lactation Note (Signed)
This note was copied from a baby's chart. Lactation Consultation Note  Patient Name: Sarah Blankenship WLSLH'T Date: 06/05/2018  I spoke with mom in [facility] while she was visiting with infant, she states she is slowly weaning from pumping her breasts, bringing in small amts EBM, she has no questions at this time   Maternal Data    Feeding Feeding Type: Breast Milk Nipple Type: Slow - flow  LATCH Score                   Interventions    Lactation Tools Discussed/Used Tools: Bottle;42F feeding tube / Syringe   Consult Status      [name] 06/05/2018, 3:02 PM

## 2018-07-21 ENCOUNTER — Encounter: Payer: Self-pay | Admitting: Obstetrics and Gynecology

## 2018-07-21 ENCOUNTER — Other Ambulatory Visit: Payer: Self-pay

## 2018-07-21 ENCOUNTER — Ambulatory Visit (INDEPENDENT_AMBULATORY_CARE_PROVIDER_SITE_OTHER): Payer: 59 | Admitting: Obstetrics and Gynecology

## 2018-07-21 MED ORDER — LEVONORGEST-ETH ESTRAD 91-DAY 0.1-0.02 & 0.01 MG PO TABS: 1.0000 | ORAL_TABLET | Freq: Every day | ORAL | 4 refills | Status: DC

## 2018-07-21 NOTE — Progress Notes (Signed)
  Subjective:     Sarah Blankenship is a 23 y.o. female who presents for a postpartum visit. She is 8 weeks postpartum following a spontaneous vaginal delivery. I have fully reviewed the prenatal and intrapartum course. The delivery was at 67 gestational weeks. Outcome: spontaneous vaginal delivery. Anesthesia: epidural. Postpartum course has been uncomplicated. Baby's course has been uncomplicated. Baby is feeding by formula only. Bleeding no bleeding. Bowel function is normal. Bladder function is normal. Patient is sexually active. Contraception method is abstinence. Postpartum depression screening: negative.  The following portions of the patient's history were reviewed and updated as appropriate: allergies, current medications, past family history, past medical history, past social history, past surgical history and problem list.  Review of Systems Pertinent items are noted in HPI.   Objective:    BP 112/81   Pulse 78   Ht 5' (1.524 m)   Wt 168 lb 9.6 oz (76.5 kg)   LMP 07/13/2018   Breastfeeding No   BMI 32.93 kg/m   General:  alert, cooperative and appears stated age   Breasts:  declined  Lungs: clear to auscultation bilaterally  Heart:  regular rate and rhythm, S1, S2 normal, no murmur, click, rub or gallop  Abdomen: soft, non-tender; bowel sounds normal; no masses,  no organomegaly   Vulva:  normal  Vagina: normal vagina, no discharge, exudate, lesion, or erythema  Cervix:  multiparous appearance  Corpus: normal size, contour, position, consistency, mobility, non-tender  Adnexa:  no mass, fullness, tenderness  Rectal Exam: Not performed.        Assessment:     8 week postpartum exam. Pap smear not done at today's visit.   Plan:    1. Contraception: OCP (estrogen/progesterone) 2. Labs obtained, will follow up accordingly 3. Follow up in: 3 months or as needed.

## 2018-07-21 NOTE — Patient Instructions (Signed)
  Place postpartum visit patient instructions here.  

## 2018-07-22 ENCOUNTER — Other Ambulatory Visit: Payer: Self-pay | Admitting: Obstetrics and Gynecology

## 2018-07-22 DIAGNOSIS — E559 Vitamin D deficiency, unspecified: Secondary | ICD-10-CM

## 2018-07-22 LAB — CBC
Hematocrit: 36.3 % (ref 34.0–46.6)
Hemoglobin: 12 g/dL (ref 11.1–15.9)
MCH: 28.9 pg (ref 26.6–33.0)
MCHC: 33.1 g/dL (ref 31.5–35.7)
MCV: 88 fL (ref 79–97)
Platelets: 543 10*3/uL — ABNORMAL HIGH (ref 150–450)
RBC: 4.15 x10E6/uL (ref 3.77–5.28)
RDW: 12.5 % (ref 11.7–15.4)
WBC: 8.3 10*3/uL (ref 3.4–10.8)

## 2018-07-22 LAB — VITAMIN D 25 HYDROXY (VIT D DEFICIENCY, FRACTURES): Vit D, 25-Hydroxy: 22.3 ng/mL — ABNORMAL LOW (ref 30.0–100.0)

## 2018-07-22 LAB — FERRITIN: Ferritin: 35 ng/mL (ref 15–150)

## 2018-07-22 MED ORDER — VITAMIN D (ERGOCALCIFEROL) 1.25 MG (50000 UNIT) PO CAPS: 50000.0000 [IU] | ORAL_CAPSULE | ORAL | 1 refills | Status: DC

## 2018-10-29 ENCOUNTER — Encounter: Payer: Self-pay | Admitting: Obstetrics and Gynecology

## 2018-10-29 ENCOUNTER — Other Ambulatory Visit (HOSPITAL_COMMUNITY)
Admission: RE | Admit: 2018-10-29 | Discharge: 2018-10-29 | Disposition: A | Discharge status: Home / Self Care | Payer: BC Managed Care – PPO | Source: Ambulatory Visit | Attending: Obstetrics and Gynecology | Admitting: Obstetrics and Gynecology

## 2018-10-29 ENCOUNTER — Other Ambulatory Visit: Payer: Self-pay

## 2018-10-29 ENCOUNTER — Ambulatory Visit (INDEPENDENT_AMBULATORY_CARE_PROVIDER_SITE_OTHER): Payer: BC Managed Care – PPO | Admitting: Obstetrics and Gynecology

## 2018-10-29 VITALS — BP 115/86 | HR 82 | Ht 60.0 in | Wt 169.5 lb

## 2018-10-29 DIAGNOSIS — Z6833 Body mass index (BMI) 33.0-33.9, adult: Secondary | ICD-10-CM

## 2018-10-29 DIAGNOSIS — Z01419 Encounter for gynecological examination (general) (routine) without abnormal findings: Secondary | ICD-10-CM

## 2018-10-29 DIAGNOSIS — E559 Vitamin D deficiency, unspecified: Secondary | ICD-10-CM | POA: Diagnosis not present

## 2018-10-29 NOTE — Progress Notes (Signed)
  Subjective:     Sarah Blankenship is a married white 23 y.o. female and is here for a comprehensive physical exam. The patient reports no problems.  Social History   Socioeconomic History  . Marital status: Married    Spouse name: Antony Haste  . Number of children: Not on file  . Years of education: Not on file  . Highest education level: Not on file  Occupational History  . Not on file  Social Needs  . Financial resource strain: Not hard at all  . Food insecurity    Worry: Patient refused    Inability: Patient refused  . Transportation needs    Medical: Patient refused    Non-medical: Patient refused  Tobacco Use  . Smoking status: Never Smoker  . Smokeless tobacco: Never Used  Substance and Sexual Activity  . Alcohol use: No  . Drug use: No  . Sexual activity: Yes    Birth control/protection: None  Lifestyle  . Physical activity    Days per week: Patient refused    Minutes per session: Patient refused  . Stress: Not at all  Relationships  . Social connections    Talks on phone: More than three times a week    Gets together: More than three times a week    Attends religious service: Never    Active member of club or organization: No    Attends meetings of clubs or organizations: Never    Relationship status: Married  . Intimate partner violence    Fear of current or ex partner: Patient refused    Emotionally abused: Patient refused    Physically abused: Patient refused    Forced sexual activity: Patient refused  Other Topics Concern  . Not on file  Social History Narrative  . Not on file   Health Maintenance  Topic Date Due  . INFLUENZA VACCINE  08/08/2018  . PAP-Cervical Cytology Screening  10/18/2019  . PAP SMEAR-Modifier  10/18/2019  . TETANUS/TDAP  03/16/2028  . HIV Screening  Completed    The following portions of the patient's history were reviewed and updated as appropriate: allergies, current medications, past family history, past medical history,  past social history, past surgical history and problem list.  Review of Systems Pertinent items noted in HPI and remainder of comprehensive ROS otherwise negative.   Objective:    BP 115/86   Pulse 82   Ht 5' (1.524 m)   Wt 169 lb 8 oz (76.9 kg)   LMP 10/20/2018 (Approximate)   BMI 33.10 kg/m  General appearance: alert, cooperative, appears stated age and moderately obese Neck: no adenopathy, no carotid bruit, no JVD, supple, symmetrical, trachea midline and thyroid not enlarged, symmetric, no tenderness/mass/nodules Lungs: clear to auscultation bilaterally Breasts: normal appearance, no masses or tenderness Heart: regular rate and rhythm, S1, S2 normal, no murmur, click, rub or gallop Abdomen: soft, non-tender; bowel sounds normal; no masses,  no organomegaly Pelvic: cervix normal in appearance, external genitalia normal, no adnexal masses or tenderness, no cervical motion tenderness, rectovaginal septum normal, uterus normal size, shape, and consistency and vagina normal without discharge    Assessment:    Healthy female exam.  BMI 33 Vitamin d def.      Plan:  Pap obtained and vit D levels checked. Encouraged regular exercise and weight loss.   RTC 1 year or as needed. Will continue on current OCPs.    See After Visit Summary for Counseling Recommendations

## 2018-10-29 NOTE — Patient Instructions (Signed)
 Preventive Care 21-25 Years Old, Female Preventive care refers to visits with your health care provider and lifestyle choices that can promote health and wellness. This includes:  A yearly physical exam. This may also be called an annual well check.  Regular dental visits and eye exams.  Immunizations.  Screening for certain conditions.  Healthy lifestyle choices, such as eating a healthy diet, getting regular exercise, not using drugs or products that contain nicotine and tobacco, and limiting alcohol use. What can I expect for my preventive care visit? Physical exam Your health care provider will check your:  Height and weight. This may be used to calculate body mass index (BMI), which tells if you are at a healthy weight.  Heart rate and blood pressure.  Skin for abnormal spots. Counseling Your health care provider may ask you questions about your:  Alcohol, tobacco, and drug use.  Emotional well-being.  Home and relationship well-being.  Sexual activity.  Eating habits.  Work and work environment.  Method of birth control.  Menstrual cycle.  Pregnancy history. What immunizations do I need?  Influenza (flu) vaccine  This is recommended every year. Tetanus, diphtheria, and pertussis (Tdap) vaccine  You may need a Td booster every 10 years. Varicella (chickenpox) vaccine  You may need this if you have not been vaccinated. Human papillomavirus (HPV) vaccine  If recommended by your health care provider, you may need three doses over 6 months. Measles, mumps, and rubella (MMR) vaccine  You may need at least one dose of MMR. You may also need a second dose. Meningococcal conjugate (MenACWY) vaccine  One dose is recommended if you are age 19-21 years and a first-year college student living in a residence hall, or if you have one of several medical conditions. You may also need additional booster doses. Pneumococcal conjugate (PCV13) vaccine  You may need  this if you have certain conditions and were not previously vaccinated. Pneumococcal polysaccharide (PPSV23) vaccine  You may need one or two doses if you smoke cigarettes or if you have certain conditions. Hepatitis A vaccine  You may need this if you have certain conditions or if you travel or work in places where you may be exposed to hepatitis A. Hepatitis B vaccine  You may need this if you have certain conditions or if you travel or work in places where you may be exposed to hepatitis B. Haemophilus influenzae type b (Hib) vaccine  You may need this if you have certain conditions. You may receive vaccines as individual doses or as more than one vaccine together in one shot (combination vaccines). Talk with your health care provider about the risks and benefits of combination vaccines. What tests do I need?  Blood tests  Lipid and cholesterol levels. These may be checked every 5 years starting at age 20.  Hepatitis C test.  Hepatitis B test. Screening  Diabetes screening. This is done by checking your blood sugar (glucose) after you have not eaten for a while (fasting).  Sexually transmitted disease (STD) testing.  BRCA-related cancer screening. This may be done if you have a family history of breast, ovarian, tubal, or peritoneal cancers.  Pelvic exam and Pap test. This may be done every 3 years starting at age 21. Starting at age 30, this may be done every 5 years if you have a Pap test in combination with an HPV test. Talk with your health care provider about your test results, treatment options, and if necessary, the need for more   tests. Follow these instructions at home: Eating and drinking   Eat a diet that includes fresh fruits and vegetables, whole grains, lean protein, and low-fat dairy.  Take vitamin and mineral supplements as recommended by your health care provider.  Do not drink alcohol if: ? Your health care provider tells you not to drink. ? You are  pregnant, may be pregnant, or are planning to become pregnant.  If you drink alcohol: ? Limit how much you have to 0-1 drink a day. ? Be aware of how much alcohol is in your drink. In the [place] one drink equals one 12 oz bottle of beer (355 mL), one 5 oz glass of wine (148 mL), or one 1 oz glass of hard liquor (44 mL). Lifestyle  Take daily care of your teeth and gums.  Stay active. Exercise for at least 30 minutes on 5 or more days each week.  Do not use any products that contain nicotine or tobacco, such as cigarettes, e-cigarettes, and chewing tobacco. If you need help quitting, ask your health care provider.  If you are sexually active, practice safe sex. Use a condom or other form of birth control (contraception) in order to prevent pregnancy and STIs (sexually transmitted infections). If you plan to become pregnant, see your health care provider for a preconception visit. What's next?  Visit your health care provider once a year for a well check visit.  Ask your health care provider how often you should have your eyes and teeth checked.  Stay up to date on all vaccines. This information is not intended to replace advice given to you by your health care provider. Make sure you discuss any questions you have with your health care provider. Document Released: 02/19/2001 Document Revised: 09/04/2017 Document Reviewed: 09/04/2017 Elsevier Patient Education  2020 [employer].

## 2018-10-29 NOTE — Progress Notes (Signed)
Patient is here for an annual exam. LPS 10/2016 WNL per pt. States she had a flu shot end of sept.

## 2018-10-30 LAB — VITAMIN D 25 HYDROXY (VIT D DEFICIENCY, FRACTURES): Vit D, 25-Hydroxy: 24.9 ng/mL — ABNORMAL LOW (ref 30.0–100.0)

## 2018-11-03 LAB — CYTOLOGY - PAP
Chlamydia: NEGATIVE
Comment: NEGATIVE
Comment: NORMAL
Diagnosis: NEGATIVE
Neisseria Gonorrhea: NEGATIVE

## 2018-11-10 ENCOUNTER — Other Ambulatory Visit: Payer: Self-pay | Admitting: *Deleted

## 2018-11-10 DIAGNOSIS — Z20822 Contact with and (suspected) exposure to covid-19: Secondary | ICD-10-CM

## 2018-11-12 LAB — NOVEL CORONAVIRUS, NAA: SARS-CoV-2, NAA: NOT DETECTED

## 2019-12-16 ENCOUNTER — Encounter: Payer: BC Managed Care – PPO | Admitting: Certified Nurse Midwife

## 2020-01-27 ENCOUNTER — Other Ambulatory Visit: Payer: Self-pay

## 2020-01-27 ENCOUNTER — Ambulatory Visit (INDEPENDENT_AMBULATORY_CARE_PROVIDER_SITE_OTHER): Payer: BC Managed Care – PPO | Admitting: Certified Nurse Midwife

## 2020-01-27 ENCOUNTER — Encounter: Payer: Self-pay | Admitting: Certified Nurse Midwife

## 2020-01-27 VITALS — BP 112/82 | HR 77 | Ht 60.0 in | Wt 177.2 lb

## 2020-01-27 DIAGNOSIS — Z23 Encounter for immunization: Secondary | ICD-10-CM

## 2020-01-27 DIAGNOSIS — E559 Vitamin D deficiency, unspecified: Secondary | ICD-10-CM

## 2020-01-27 DIAGNOSIS — Z113 Encounter for screening for infections with a predominantly sexual mode of transmission: Secondary | ICD-10-CM

## 2020-01-27 DIAGNOSIS — Z01419 Encounter for gynecological examination (general) (routine) without abnormal findings: Secondary | ICD-10-CM | POA: Diagnosis not present

## 2020-01-27 DIAGNOSIS — Z6834 Body mass index (BMI) 34.0-34.9, adult: Secondary | ICD-10-CM

## 2020-01-27 NOTE — Patient Instructions (Signed)
Preventive Care 21-27 Years Old, Female Preventive care refers to lifestyle choices and visits with your health care provider that can promote health and wellness. This includes:  A yearly physical exam. This is also called an annual wellness visit.  Regular dental and eye exams.  Immunizations.  Screening for certain conditions.  Healthy lifestyle choices, such as: ? Eating a healthy diet. ? Getting regular exercise. ? Not using drugs or products that contain nicotine and tobacco. ? Limiting alcohol use. What can I expect for my preventive care visit? Physical exam Your health care provider may check your:  Height and weight. These may be used to calculate your BMI (body mass index). BMI is a measurement that tells if you are at a healthy weight.  Heart rate and blood pressure.  Body temperature.  Skin for abnormal spots. Counseling Your health care provider may ask you questions about your:  Past medical problems.  Family's medical history.  Alcohol, tobacco, and drug use.  Emotional well-being.  Home life and relationship well-being.  Sexual activity.  Diet, exercise, and sleep habits.  Work and work environment.  Access to firearms.  Method of birth control.  Menstrual cycle.  Pregnancy history. What immunizations do I need? Vaccines are usually given at various ages, according to a schedule. Your health care provider will recommend vaccines for you based on your age, medical history, and lifestyle or other factors, such as travel or where you work.   What tests do I need? Blood tests  Lipid and cholesterol levels. These may be checked every 5 years starting at age 20.  Hepatitis C test.  Hepatitis B test. Screening  Diabetes screening. This is done by checking your blood sugar (glucose) after you have not eaten for a while (fasting).  STD (sexually transmitted disease) testing, if you are at risk.  BRCA-related cancer screening. This may be  done if you have a family history of breast, ovarian, tubal, or peritoneal cancers.  Pelvic exam and Pap test. This may be done every 3 years starting at age 21. Starting at age 30, this may be done every 5 years if you have a Pap test in combination with an HPV test. Talk with your health care provider about your test results, treatment options, and if necessary, the need for more tests.   Follow these instructions at home: Eating and drinking  Eat a healthy diet that includes fresh fruits and vegetables, whole grains, lean protein, and low-fat dairy products.  Take vitamin and mineral supplements as recommended by your health care provider.  Do not drink alcohol if: ? Your health care provider tells you not to drink. ? You are pregnant, may be pregnant, or are planning to become pregnant.  If you drink alcohol: ? Limit how much you have to 0-1 drink a day. ? Be aware of how much alcohol is in your drink. In the [place] one drink equals one 12 oz bottle of beer (355 mL), one 5 oz glass of wine (148 mL), or one 1 oz glass of hard liquor (44 mL).   Lifestyle  Take daily care of your teeth and gums. Brush your teeth every morning and night with fluoride toothpaste. Floss one time each day.  Stay active. Exercise for at least 30 minutes 5 or more days each week.  Do not use any products that contain nicotine or tobacco, such as cigarettes, e-cigarettes, and chewing tobacco. If you need help quitting, ask your health care provider.  Do not   use drugs.  If you are sexually active, practice safe sex. Use a condom or other form of protection to prevent STIs (sexually transmitted infections).  If you do not wish to become pregnant, use a form of birth control. If you plan to become pregnant, see your health care provider for a prepregnancy visit.  Find healthy ways to cope with stress, such as: ? Meditation, yoga, or listening to music. ? Journaling. ? Talking to a trusted  person. ? Spending time with friends and family. Safety  Always wear your seat belt while driving or riding in a vehicle.  Do not drive: ? If you have been drinking alcohol. Do not ride with someone who has been drinking. ? When you are tired or distracted. ? While texting.  Wear a helmet and other protective equipment during sports activities.  If you have firearms in your house, make sure you follow all gun safety procedures.  Seek help if you have been physically or sexually abused. What's next?  Go to your health care provider once a year for an annual wellness visit.  Ask your health care provider how often you should have your eyes and teeth checked.  Stay up to date on all vaccines. This information is not intended to replace advice given to you by your health care provider. Make sure you discuss any questions you have with your health care provider. Document Revised: 08/22/2019 Document Reviewed: 09/04/2017 Elsevier Patient Education  2021 [employer]  

## 2020-01-27 NOTE — Progress Notes (Signed)
ANNUAL PREVENTATIVE CARE GYN  ENCOUNTER NOTE  Subjective:       Sarah Blankenship is a 25 y.o. G24P1001 female here for a routine annual gynecologic exam.  Current complaints: 1. Requests labs 2. Reports intermittent back pain since epidural placement   Denies difficulty breathing or respiratory distress, chest pain, abdominal pain, excessive vaginal bleeding, dysuria, and leg pain or swelling.    Gynecologic History  Patient's last menstrual period was 01/16/2018 (exact date).  Contraception: none  Last Pap: 10/2018. Results were: normal  Obstetric History  OB History  Gravida Para Term Preterm AB Living  1 1 1  0 0 1  SAB IAB Ectopic Multiple Live Births  0 0 0 0 1    # Outcome Date GA Lbr Len/2nd Weight Sex Delivery Anes PTL Lv  1 Term 05/28/18 [redacted]w[redacted]d  4 lb 7 oz (2.013 kg) F Vag-Spont EPI N LIV     Complications: Low amniotic fluid    Past Medical History:  Diagnosis Date  . Anemia   . Seasonal allergies   . Tachycardia     Past Surgical History:  Procedure Laterality Date  . MULTIPLE TOOTH EXTRACTIONS    . NASAL FRACTURE SURGERY      Current Outpatient Medications on File Prior to Visit  Medication Sig Dispense Refill  . cetirizine (ZYRTEC) 10 MG tablet Take 10 mg by mouth daily.     No current facility-administered medications on file prior to visit.    Allergies  Allergen Reactions  . Cefzil [Cefprozil] Rash    Social History   Socioeconomic History  . Marital status: Married    Spouse name: [redacted]w[redacted]d  . Number of children: Not on file  . Years of education: Not on file  . Highest education level: Not on file  Occupational History  . Not on file  Tobacco Use  . Smoking status: Never Smoker  . Smokeless tobacco: Never Used  Vaping Use  . Vaping Use: Never used  Substance and Sexual Activity  . Alcohol use: No  . Drug use: No  . Sexual activity: Yes    Birth control/protection: None  Other Topics Concern  . Not on file  Social History  Narrative  . Not on file   Social Determinants of Health   Financial Resource Strain: Not on file  Food Insecurity: Not on file  Transportation Needs: Not on file  Physical Activity: Not on file  Stress: Not on file  Social Connections: Not on file  Intimate Partner Violence: Not on file    Family History  Problem Relation Age of Onset  . Thyroid disease Paternal Grandmother   . Diabetes Maternal Grandmother   . Breast cancer Maternal Grandmother   . Diabetes Maternal Grandfather   . Diabetes Mother   . Ovarian cancer Neg Hx   . Colon cancer Neg Hx     The following portions of the patient's history were reviewed and updated as appropriate: allergies, current medications, past family history, past medical history, past social history, past surgical history and problem list.  Review of Systems  ROS negative except as noted above. Information obtained from patient.    Objective:   BP 112/82   Pulse 77   Ht 5' (1.524 m)   Wt 177 lb 3 oz (80.4 kg)   LMP 01/16/2018 (Exact Date)   BMI 34.60 kg/m    CONSTITUTIONAL: Well-developed, well-nourished female in no acute distress.   PSYCHIATRIC: Normal mood and affect. Normal behavior. Normal judgment and  thought content.  NEUROLGIC: Alert and oriented to person, place, and time. Normal muscle tone coordination. No cranial nerve deficit noted.  HENT:  Normocephalic, atraumatic, External right and left ear normal.   EYES: Conjunctivae and EOM are normal. Pupils are equal and round.   NECK: Normal range of motion, supple, no masses.  Normal thyroid.   SKIN: Skin is warm and dry. No rash noted. Not diaphoretic. No erythema. No pallor.  CARDIOVASCULAR: Normal heart rate noted, regular rhythm, no murmur.  RESPIRATORY: Clear to auscultation bilaterally. Effort and breath sounds normal, no problems with respiration noted.  BREASTS: Symmetric in size. No masses, skin changes, nipple drainage, or lymphadenopathy.  ABDOMEN: Soft,  normal bowel sounds, no distention noted.  No tenderness, rebound or guarding.   PELVIC:  External Genitalia: Normal  Vagina: Normal  Cervix: Normal  Uterus: Normal  Adnexa: Normal  MUSCULOSKELETAL: Normal range of motion. No tenderness.  No cyanosis, clubbing, or edema.  2+ distal pulses.  LYMPHATIC: No Axillary, Supraclavicular, or Inguinal Adenopathy.  Assessment:   Annual gynecologic examination 25 y.o.   Contraception: none   Obesity 1   Problem List Items Addressed This Visit      Other   Vitamin D deficiency    Other Visit Diagnoses    Well woman exam    -  Primary   BMI 34.0-34.9,adult          Plan:   Pap: Not needed  Labs: See orders  Discussed home treatment measures for management of post-epidural back pain  Routine preventative health maintenance measures emphasized: Exercise/Diet/Weight control, Tobacco Warnings, Alcohol/Substance use risks and Stress Management; see AVS  Reviewed red flag symptoms and when to call  Return to Clinic - 1 Year for [facility] or sooner if needed   Serafina Royals, CNM  [facility], [facility] 01/27/20 3:58 PM

## 2020-01-28 ENCOUNTER — Other Ambulatory Visit: Payer: Self-pay | Admitting: Certified Nurse Midwife

## 2020-01-28 DIAGNOSIS — E559 Vitamin D deficiency, unspecified: Secondary | ICD-10-CM

## 2020-01-28 LAB — VIRAL HEPATITIS HBV, HCV
HCV Ab: <0.1 s/co ratio (ref 0.0–0.9)
Hep B Core Total Ab: NEGATIVE
Hep B Surface Ab, Qual: NONREACTIVE
Hepatitis B Surface Ag: NEGATIVE

## 2020-01-28 LAB — TSH+T4F+T3FREE
Free T4: 1.25 ng/dL (ref 0.82–1.77)
T3, Free: 4 pg/mL (ref 2.0–4.4)
TSH: 2.52 u[IU]/mL (ref 0.450–4.500)

## 2020-01-28 LAB — CBC
Hematocrit: 36 % (ref 34.0–46.6)
Hemoglobin: 12.1 g/dL (ref 11.1–15.9)
MCH: 28.5 pg (ref 26.6–33.0)
MCHC: 33.6 g/dL (ref 31.5–35.7)
MCV: 85 fL (ref 79–97)
Platelets: 654 10*3/uL — ABNORMAL HIGH (ref 150–450)
RBC: 4.24 x10E6/uL (ref 3.77–5.28)
RDW: 12.4 % (ref 11.7–15.4)
WBC: 10.7 10*3/uL (ref 3.4–10.8)

## 2020-01-28 LAB — HEMOGLOBIN A1C
Est. average glucose Bld gHb Est-mCnc: 100 mg/dL
Hgb A1c MFr Bld: 5.1 % (ref 4.8–5.6)

## 2020-01-28 LAB — VITAMIN D 25 HYDROXY (VIT D DEFICIENCY, FRACTURES): Vit D, 25-Hydroxy: 14.8 ng/mL — ABNORMAL LOW (ref 30.0–100.0)

## 2020-01-28 LAB — HCV INTERPRETATION

## 2020-01-28 MED ORDER — VITAMIN D (ERGOCALCIFEROL) 1.25 MG (50000 UNIT) PO CAPS: 50000.0000 [IU] | ORAL_CAPSULE | ORAL | 1 refills | Status: DC

## 2020-01-28 NOTE — Progress Notes (Signed)
Rx Vitamin D, see orders.    Serafina Royals, CNM [facility], [facility] 01/28/20 5:26 PM

## 2020-02-28 ENCOUNTER — Encounter: Payer: BC Managed Care – PPO | Admitting: Certified Nurse Midwife

## 2020-03-02 ENCOUNTER — Ambulatory Visit (INDEPENDENT_AMBULATORY_CARE_PROVIDER_SITE_OTHER): Payer: BC Managed Care – PPO | Admitting: Certified Nurse Midwife

## 2020-03-02 ENCOUNTER — Other Ambulatory Visit: Payer: Self-pay

## 2020-03-02 VITALS — BP 116/87 | HR 88 | Resp 16 | Ht 60.0 in | Wt 168.8 lb

## 2020-03-02 DIAGNOSIS — R5383 Other fatigue: Secondary | ICD-10-CM | POA: Diagnosis not present

## 2020-03-02 DIAGNOSIS — E559 Vitamin D deficiency, unspecified: Secondary | ICD-10-CM

## 2020-03-02 DIAGNOSIS — D691 Qualitative platelet defects: Secondary | ICD-10-CM | POA: Diagnosis not present

## 2020-03-02 NOTE — Patient Instructions (Signed)
Reactive Thrombocytosis Reactive thrombocytosis is when you have too many platelets (thrombocytes) in your blood. Platelets are tiny elements in the blood that stick together and form a clot (thrombus). Platelets help your body stop bleeding. Conditions that cause inflammation, such as cancer, may trigger your body to make more platelets than normal. This condition may also be called secondary thrombocytosis. What are the causes? This condition may be caused by:  Having your spleen surgically removed (splenectomy).  Injury.  Certain infections.  Severe bleeding.  Low red blood cell count from not having enough iron (iron deficiency anemia).  Having a disease that destroys your red blood cells (hemolytic anemia).  Not having enough vitamin B12.  Inflammatory bowel disease (Crohn's disease or ulcerative colitis).  Cancer, especially lymphoma, breast, stomach, and ovarian cancers.  Alcohol abuse.  Certain medicines. What are the signs or symptoms? It can be hard to tell the difference between symptoms of reactive thrombocytosis and symptoms of the underlying condition. Symptoms of this condition may include:  Weakness.  Headache.  Dizziness or confusion.  Chest pain or shortness of breath.  Tingling or burning in your hands or feet. How is this diagnosed? This condition may be diagnosed based on routine blood tests or while you are being evaluated for another condition. You may also need tests to confirm the diagnosis. These may include:  Additional blood tests.  A procedure to collect a sample of bone marrow (bone marrow aspiration).   How is this treated? Treatment for this condition depends on the cause.  Your platelet count may return to normal after the underlying cause is treated.  If your platelet count is very high, you may have to take medicine to prevent blood clots as told by your health care provider. Follow these instructions at home:  Take  over-the-counter and prescription medicines only as told by your health care provider.  Work with your health care provider to: ? Treat the condition that is causing reactive thrombocytosis. ? Control any other conditions you may have, such as high blood pressure, high cholesterol, and diabetes.  Do not use any products that contain nicotine or tobacco, such as cigarettes, e-cigarettes, and chewing tobacco. If you need help quitting, ask your health care provider.  Keep all follow-up visits as told by your health care provider. This is important.   Contact a health care provider if:  You have a headache that you cannot control.  You faint. Get help right away if:  You suddenly develop a headache, weakness, or drooping in your face.  You suddenly cannot speak or understand speech.  You have chest pain.  You have trouble breathing. Summary  Reactive thrombocytosis is when you have too many platelets (thrombocytes) in your blood.  Platelets help your body stop bleeding.  Conditions that cause inflammation may trigger your body to make more platelets than normal.  Treatment for this condition depends on the cause. This information is not intended to replace advice given to you by your health care provider. Make sure you discuss any questions you have with your health care provider. Document Revised: 06/09/2019 Document Reviewed: 06/09/2019 Elsevier Patient Education  [place] 27-73 Years Old, Female Preventive care refers to lifestyle choices and visits with your health care provider that can promote health and wellness. This includes:  A yearly physical exam. This is also called an annual wellness visit.  Regular dental and eye exams.  Immunizations.  Screening for certain conditions.  Healthy lifestyle  choices, such as: ? Eating a healthy diet. ? Getting regular exercise. ? Not using drugs or products that contain nicotine and  tobacco. ? Limiting alcohol use. What can I expect for my preventive care visit? Physical exam Your health care provider may check your:  Height and weight. These may be used to calculate your BMI (body mass index). BMI is a measurement that tells if you are at a healthy weight.  Heart rate and blood pressure.  Body temperature.  Skin for abnormal spots. Counseling Your health care provider may ask you questions about your:  Past medical problems.  Family's medical history.  Alcohol, tobacco, and drug use.  Emotional well-being.  Home life and relationship well-being.  Sexual activity.  Diet, exercise, and sleep habits.  Work and work Astronomer.  Access to firearms.  Method of birth control.  Menstrual cycle.  Pregnancy history. What immunizations do I need? Vaccines are usually given at various ages, according to a schedule. Your health care provider will recommend vaccines for you based on your age, medical history, and lifestyle or other factors, such as travel or where you work.   What tests do I need? Blood tests  Lipid and cholesterol levels. These may be checked every 5 years starting at age 87.  Hepatitis C test.  Hepatitis B test. Screening  Diabetes screening. This is done by checking your blood sugar (glucose) after you have not eaten for a while (fasting).  STD (sexually transmitted disease) testing, if you are at risk.  BRCA-related cancer screening. This may be done if you have a family history of breast, ovarian, tubal, or peritoneal cancers.  Pelvic exam and Pap test. This may be done every 3 years starting at age 5. Starting at age 1, this may be done every 5 years if you have a Pap test in combination with an HPV test. Talk with your health care provider about your test results, treatment options, and if necessary, the need for more tests.   Follow these instructions at home: Eating and drinking  Eat a healthy diet that includes  fresh fruits and vegetables, whole grains, lean protein, and low-fat dairy products.  Take vitamin and mineral supplements as recommended by your health care provider.  Do not drink alcohol if: ? Your health care provider tells you not to drink. ? You are pregnant, may be pregnant, or are planning to become pregnant.  If you drink alcohol: ? Limit how much you have to 0-1 drink a day. ? Be aware of how much alcohol is in your drink. In the [place] one drink equals one 12 oz bottle of beer (355 mL), one 5 oz glass of wine (148 mL), or one 1 oz glass of hard liquor (44 mL).   Lifestyle  Take daily care of your teeth and gums. Brush your teeth every morning and night with fluoride toothpaste. Floss one time each day.  Stay active. Exercise for at least 30 minutes 5 or more days each week.  Do not use any products that contain nicotine or tobacco, such as cigarettes, e-cigarettes, and chewing tobacco. If you need help quitting, ask your health care provider.  Do not use drugs.  If you are sexually active, practice safe sex. Use a condom or other form of protection to prevent STIs (sexually transmitted infections).  If you do not wish to become pregnant, use a form of birth control. If you plan to become pregnant, see your health care provider for a prepregnancy visit.  Find healthy ways to cope with stress, such as: ? Meditation, yoga, or listening to music. ? Journaling. ? Talking to a trusted person. ? Spending time with friends and family. Safety  Always wear your seat belt while driving or riding in a vehicle.  Do not drive: ? If you have been drinking alcohol. Do not ride with someone who has been drinking. ? When you are tired or distracted. ? While texting.  Wear a helmet and other protective equipment during sports activities.  If you have firearms in your house, make sure you follow all gun safety procedures.  Seek help if you have been physically or sexually  abused. What's next?  Go to your health care provider once a year for an annual wellness visit.  Ask your health care provider how often you should have your eyes and teeth checked.  Stay up to date on all vaccines. This information is not intended to replace advice given to you by your health care provider. Make sure you discuss any questions you have with your health care provider. Document Revised: 08/22/2019 Document Reviewed: 09/04/2017 Elsevier Patient Education  2021 [employer].

## 2020-03-02 NOTE — Progress Notes (Signed)
GYN ENCOUNTER NOTE  Subjective:       Sarah Blankenship is a 25 y.o. G97P1001 female here for repeat labs due to elevated platelets on CBC collected after ANNUAL EXAM.   Reports fatigue. Questions anemia. Taking Vitamin D supplement as prescribed.   Denies difficulty breathing or respiratory distress, chest pain, abdominal pain, excessive vaginal bleeding, dysuria, and leg pain or swelling.    Gynecologic History  Patient's last menstrual period was 02/23/2020 (exact date).  Contraception: none  Last Pap: 10/2018. Results were: normal  Obstetric History  OB History  Gravida Para Term Preterm AB Living  1 1 1  0 0 1  SAB IAB Ectopic Multiple Live Births  0 0 0 0 1    # Outcome Date GA Lbr Len/2nd Weight Sex Delivery Anes PTL Lv  1 Term 05/28/18 [redacted]w[redacted]d  4 lb 7 oz (2.013 kg) F Vag-Spont EPI N LIV     Complications: Low amniotic fluid    Past Medical History:  Diagnosis Date  . Anemia   . Seasonal allergies   . Tachycardia     Past Surgical History:  Procedure Laterality Date  . MULTIPLE TOOTH EXTRACTIONS    . NASAL FRACTURE SURGERY      Current Outpatient Medications on File Prior to Visit  Medication Sig Dispense Refill  . cetirizine (ZYRTEC) 10 MG tablet Take 10 mg by mouth daily.    . Vitamin D, Ergocalciferol, (DRISDOL) 1.25 MG (50000 UNIT) CAPS capsule Take 1 capsule (50,000 Units total) by mouth every 7 (seven) days. 14 capsule 1   No current facility-administered medications on file prior to visit.    Allergies  Allergen Reactions  . Cefzil [Cefprozil] Rash    Social History   Socioeconomic History  . Marital status: Married    Spouse name: [redacted]w[redacted]d  . Number of children: Not on file  . Years of education: Not on file  . Highest education level: Not on file  Occupational History  . Not on file  Tobacco Use  . Smoking status: Never Smoker  . Smokeless tobacco: Never Used  Vaping Use  . Vaping Use: Never used  Substance and Sexual Activity  .  Alcohol use: No  . Drug use: No  . Sexual activity: Yes    Birth control/protection: None  Other Topics Concern  . Not on file  Social History Narrative  . Not on file   Social Determinants of Health   Financial Resource Strain: Not on file  Food Insecurity: Not on file  Transportation Needs: Not on file  Physical Activity: Not on file  Stress: Not on file  Social Connections: Not on file  Intimate Partner Violence: Not on file    Family History  Problem Relation Age of Onset  . Thyroid disease Paternal Grandmother   . Diabetes Maternal Grandmother   . Breast cancer Maternal Grandmother   . Diabetes Maternal Grandfather   . Diabetes Mother   . Ovarian cancer Neg Hx   . Colon cancer Neg Hx     The following portions of the patient's history were reviewed and updated as appropriate: allergies, current medications, past family history, past medical history, past social history, past surgical history and problem list.  Review of Systems  ROS negative except as noted above. Information obtained from patient.   Objective:   BP 116/87   Pulse 88   Resp 16   Ht 5' (1.524 m)   Wt 168 lb 12.8 oz (76.6 kg)   LMP  02/23/2020 (Exact Date)   BMI 32.97 kg/m    CONSTITUTIONAL: Well-developed, well-nourished female in no acute distress.   PHYSICAL EXAM: Not indicated.   Recent Results (from the past 2160 hour(s))  CBC     Status: Abnormal   Collection Time: 01/27/20  4:04 PM  Result Value Ref Range   WBC 10.7 3.4 - 10.8 x10E3/uL   RBC 4.24 3.77 - 5.28 x10E6/uL   Hemoglobin 12.1 11.1 - 15.9 g/dL   Hematocrit 16.1 09.6 - 46.6 %   MCV 85 79 - 97 fL   MCH 28.5 26.6 - 33.0 pg   MCHC 33.6 31.5 - 35.7 g/dL   RDW 04.5 40.9 - 81.1 %   Platelets 654 (H) 150 - 450 x10E3/uL  Hemoglobin A1c     Status: None   Collection Time: 01/27/20  4:04 PM  Result Value Ref Range   Hgb A1c MFr Bld 5.1 4.8 - 5.6 %    Comment:          Prediabetes: 5.7 - 6.4          Diabetes: >6.4           Glycemic control for adults with diabetes: <7.0    Est. average glucose Bld gHb Est-mCnc 100 mg/dL  VITAMIN D 25 Hydroxy (Vit-D Deficiency, Fractures)     Status: Abnormal   Collection Time: 01/27/20  4:04 PM  Result Value Ref Range   Vit D, 25-Hydroxy 14.8 (L) 30.0 - 100.0 ng/mL    Comment: Vitamin D deficiency has been defined by the Institute of Medicine and an Endocrine Society practice guideline as a level of serum 25-OH vitamin D less than 20 ng/mL (1,2). The Endocrine Society went on to further define vitamin D insufficiency as a level between 21 and 29 ng/mL (2). 1. IOM (Institute of Medicine). 2010. Dietary reference    intakes for calcium and D. [place]: The    [employer]. 2. Holick MF, [place], Bischoff-Ferrari HA, et al.    Evaluation, treatment, and prevention of vitamin D    deficiency: an Endocrine Society clinical practice    guideline. JCEM. 2011 Jul; 96(7):1911-30.   TSH+T4F+T3Free     Status: None   Collection Time: 01/27/20  4:04 PM  Result Value Ref Range   TSH 2.520 0.450 - 4.500 uIU/mL   T3, Free 4.0 2.0 - 4.4 pg/mL   Free T4 1.25 0.82 - 1.77 ng/dL  Viral Hepatitis HBV, HCV     Status: None   Collection Time: 01/27/20  4:04 PM  Result Value Ref Range   Hepatitis B Surface Ag Negative Negative   Hep B Surface Ab, Qual Non Reactive     Comment:               Non Reactive: Inconsistent with immunity,                             less than 10 mIU/mL               Reactive:     Consistent with immunity,                             greater than 9.9 mIU/mL    Hep B Core Total Ab Negative Negative   HCV Ab <0.1 0.0 - 0.9 s/co ratio  Interpretation:     Status: None   Collection  Time: 01/27/20  4:04 PM  Result Value Ref Range   HCV Interp 1: Comment     Comment: Negative Not infected with HCV, unless recent infection is suspected or other evidence exists to indicate HCV infection.     Assessment:   1. Abnormal platelets (HCC)  -  CBC - Ferritin - B12 and Folate Panel  2. Other fatigue  - CBC - Ferritin - B12 and Folate Panel  3. Vitamin D deficiency  - B12 and Folate Panel    Plan:   Labs today, see orders. Will contact patient with results.   Reviewed red flag symptoms and when to call.   RTC as previously scheduled or sooner if needed.    Serafina Royals, CNM [facility], [facility] 03/02/20 4:13 PM

## 2020-03-03 LAB — B12 AND FOLATE PANEL
Folate: 7.9 ng/mL (ref >3.0)
Vitamin B-12: 471 pg/mL (ref 232–1245)

## 2020-03-03 LAB — CBC
Hematocrit: 37.7 % (ref 34.0–46.6)
Hemoglobin: 12.6 g/dL (ref 11.1–15.9)
MCH: 28.9 pg (ref 26.6–33.0)
MCHC: 33.4 g/dL (ref 31.5–35.7)
MCV: 87 fL (ref 79–97)
Platelets: 555 10*3/uL — ABNORMAL HIGH (ref 150–450)
RBC: 4.36 x10E6/uL (ref 3.77–5.28)
RDW: 12.6 % (ref 11.7–15.4)
WBC: 10.7 10*3/uL (ref 3.4–10.8)

## 2020-03-03 LAB — FERRITIN: Ferritin: 33 ng/mL (ref 15–150)

## 2020-03-06 ENCOUNTER — Other Ambulatory Visit: Payer: Self-pay | Admitting: Certified Nurse Midwife

## 2020-03-06 DIAGNOSIS — D691 Qualitative platelet defects: Secondary | ICD-10-CM

## 2020-03-06 NOTE — Progress Notes (Signed)
Referral to Hematology, see orders.    Sarah Blankenship, CNM [facility], [facility] 03/06/20 10:58 AM

## 2020-03-21 ENCOUNTER — Encounter: Payer: Self-pay | Admitting: Oncology

## 2020-03-21 ENCOUNTER — Inpatient Hospital Stay: Payer: BC Managed Care – PPO | Attending: Oncology | Admitting: Oncology

## 2020-03-21 ENCOUNTER — Inpatient Hospital Stay: Payer: BC Managed Care – PPO

## 2020-03-21 ENCOUNTER — Other Ambulatory Visit: Payer: Self-pay

## 2020-03-21 VITALS — BP 124/90 | HR 85 | Temp 99.8°F | Resp 18 | Ht 60.0 in | Wt 169.2 lb

## 2020-03-21 DIAGNOSIS — D75839 Thrombocytosis, unspecified: Secondary | ICD-10-CM | POA: Diagnosis present

## 2020-03-21 DIAGNOSIS — Z803 Family history of malignant neoplasm of breast: Secondary | ICD-10-CM | POA: Diagnosis not present

## 2020-03-21 DIAGNOSIS — Z833 Family history of diabetes mellitus: Secondary | ICD-10-CM | POA: Insufficient documentation

## 2020-03-21 DIAGNOSIS — Z8349 Family history of other endocrine, nutritional and metabolic diseases: Secondary | ICD-10-CM | POA: Insufficient documentation

## 2020-03-21 DIAGNOSIS — Z8249 Family history of ischemic heart disease and other diseases of the circulatory system: Secondary | ICD-10-CM | POA: Insufficient documentation

## 2020-03-21 LAB — CBC WITH DIFFERENTIAL/PLATELET
Abs Immature Granulocytes: 0.1 10*3/uL — ABNORMAL HIGH (ref 0.00–0.07)
Basophils Absolute: 0.1 10*3/uL (ref 0.0–0.1)
Basophils Relative: 1 %
Eosinophils Absolute: 0.1 10*3/uL (ref 0.0–0.5)
Eosinophils Relative: 1 %
HCT: 36.3 % (ref 36.0–46.0)
Hemoglobin: 12.4 g/dL (ref 12.0–15.0)
Immature Granulocytes: 1 %
Lymphocytes Relative: 23 %
Lymphs Abs: 2.3 10*3/uL (ref 0.7–4.0)
MCH: 29.1 pg (ref 26.0–34.0)
MCHC: 34.2 g/dL (ref 30.0–36.0)
MCV: 85.2 fL (ref 80.0–100.0)
Monocytes Absolute: 0.6 10*3/uL (ref 0.1–1.0)
Monocytes Relative: 6 %
Neutro Abs: 6.9 10*3/uL (ref 1.7–7.7)
Neutrophils Relative %: 68 %
Platelets: 485 10*3/uL — ABNORMAL HIGH (ref 150–400)
RBC: 4.26 MIL/uL (ref 3.87–5.11)
RDW: 13.1 % (ref 11.5–15.5)
WBC: 10.1 10*3/uL (ref 4.0–10.5)
nRBC: 0 % (ref 0.0–0.2)

## 2020-03-21 LAB — LACTATE DEHYDROGENASE: LDH: 120 U/L (ref 98–192)

## 2020-03-21 LAB — RETIC PANEL
Immature Retic Fract: 13.4 % (ref 2.3–15.9)
RBC.: 4.37 MIL/uL (ref 3.87–5.11)
Retic Count, Absolute: 93.5 10*3/uL (ref 19.0–186.0)
Retic Ct Pct: 2.1 % (ref 0.4–3.1)
Reticulocyte Hemoglobin: 33.2 pg (ref >27.9)

## 2020-03-21 LAB — COMPREHENSIVE METABOLIC PANEL
ALT: 22 U/L (ref 0–44)
AST: 21 U/L (ref 15–41)
Albumin: 4.3 g/dL (ref 3.5–5.0)
Alkaline Phosphatase: 59 U/L (ref 38–126)
Anion gap: 13 (ref 5–15)
BUN: 13 mg/dL (ref 6–20)
CO2: 21 mmol/L — ABNORMAL LOW (ref 22–32)
Calcium: 9.2 mg/dL (ref 8.9–10.3)
Chloride: 101 mmol/L (ref 98–111)
Creatinine, Ser: 0.65 mg/dL (ref 0.44–1.00)
GFR, Estimated: >60 mL/min (ref >60)
Glucose, Bld: 90 mg/dL (ref 70–99)
Potassium: 3.8 mmol/L (ref 3.5–5.1)
Sodium: 135 mmol/L (ref 135–145)
Total Bilirubin: 0.6 mg/dL (ref 0.3–1.2)
Total Protein: 7.9 g/dL (ref 6.5–8.1)

## 2020-03-21 LAB — PATHOLOGIST SMEAR REVIEW

## 2020-03-21 LAB — IRON AND TIBC
Iron: 54 ug/dL (ref 28–170)
Saturation Ratios: 13 % (ref 10.4–31.8)
TIBC: 430 ug/dL (ref 250–450)
UIBC: 376 ug/dL

## 2020-03-21 LAB — HIV ANTIBODY (ROUTINE TESTING W REFLEX): HIV Screen 4th Generation wRfx: NONREACTIVE

## 2020-03-21 LAB — FERRITIN: Ferritin: 24 ng/mL (ref 11–307)

## 2020-03-21 LAB — C-REACTIVE PROTEIN: CRP: 3.3 mg/dL — ABNORMAL HIGH (ref <1.0)

## 2020-03-21 NOTE — Progress Notes (Signed)
Patient here to establish care for abnormal platelets. Patient has extensive maternal family history of breast cancer.

## 2020-03-21 NOTE — Progress Notes (Signed)
Hematology/Oncology Consult note [facility] [phone] [phone]   Patient Care Team: Pllc, [facility] as PCP - General (Family Medicine)  REFERRING PROVIDER: Annye Asa*  CHIEF COMPLAINTS/REASON FOR VISIT:   thrombocytosis  HISTORY OF PRESENTING ILLNESS:  Sarah Blankenship is a 25 y.o. female who was seen in consultation at the request of Jeralyn Bennett, Lisbeth Ply* for evaluation of thromcbocytosis Reviewed patient's labs.  03/02/2020 labs showed elevated platelet counts at 555,000.  01/27/2020, platelet count 654,000. wbc  and hemoglobin are both normal.  Reviewed patient's previous labs. Thrombocytosis onset is chronic onset , duration since July 2020.  She had normal platelet count from March 2020-May 2020.  In 2019 her platelet count was slightly elevated at 466.  Patient provides history of having high white count, platelet counts when she was younger and was previously seen by [facility] hematologist in 2006.   No aggravating or elevated factors. Associated symptoms or signs:  Denies weight loss, fever, chills, night sweats.  Sometimes she feels tired.  She denies chronic inflammation, arthralgia, nonhealing wound, rash, prosthetic joints, implant, IUD etc. Smoking history: Denies Family history of polycythemia.  Denies History of iron deficiency anemia; denies History of DVT: Denies. Family history is significant for breast cancer on maternal side.  Review of Systems  Constitutional: Positive for fatigue. Negative for appetite change, chills and fever.  HENT:   Negative for hearing loss and voice change.   Eyes: Negative for eye problems.  Respiratory: Negative for chest tightness and cough.   Cardiovascular: Negative for chest pain.  Gastrointestinal: Negative for abdominal distention, abdominal pain and blood in stool.  Endocrine: Negative for hot flashes.  Genitourinary: Negative for difficulty  urinating and frequency.   Musculoskeletal: Negative for arthralgias.  Skin: Negative for itching and rash.  Neurological: Negative for extremity weakness.  Hematological: Negative for adenopathy.  Psychiatric/Behavioral: Negative for confusion.    MEDICAL HISTORY:  Past Medical History:  Diagnosis Date  . Anemia   . Seasonal allergies   . Tachycardia     SURGICAL HISTORY: Past Surgical History:  Procedure Laterality Date  . MULTIPLE TOOTH EXTRACTIONS    . NASAL FRACTURE SURGERY      SOCIAL HISTORY: Social History   Socioeconomic History  . Marital status: Married    Spouse name: Hessie Diener  . Number of children: Not on file  . Years of education: Not on file  . Highest education level: Not on file  Occupational History  . Not on file  Tobacco Use  . Smoking status: Never Smoker  . Smokeless tobacco: Never Used  Vaping Use  . Vaping Use: Never used  Substance and Sexual Activity  . Alcohol use: No  . Drug use: No  . Sexual activity: Yes    Birth control/protection: None  Other Topics Concern  . Not on file  Social History Narrative  . Not on file   Social Determinants of Health   Financial Resource Strain: Not on file  Food Insecurity: Not on file  Transportation Needs: Not on file  Physical Activity: Not on file  Stress: Not on file  Social Connections: Not on file  Intimate Partner Violence: Not on file    FAMILY HISTORY: Family History  Problem Relation Age of Onset  . Thyroid disease Paternal Grandmother   . Diabetes Maternal Grandmother   . Breast cancer Maternal Grandmother   . Diabetes Maternal Grandfather   . Diabetes Mother   . Hypertension Mother   .  High Cholesterol Mother   . Breast cancer Maternal Aunt   . Breast cancer Maternal Aunt   . Breast cancer Cousin   . Ovarian cancer Neg Hx   . Colon cancer Neg Hx     ALLERGIES:  is allergic to cefzil [cefprozil].  MEDICATIONS:  Current Outpatient Medications  Medication Sig Dispense  Refill  . cetirizine (ZYRTEC) 10 MG tablet Take 10 mg by mouth daily.    . Vitamin D, Ergocalciferol, (DRISDOL) 1.25 MG (50000 UNIT) CAPS capsule Take 1 capsule (50,000 Units total) by mouth every 7 (seven) days. 14 capsule 1   No current facility-administered medications for this visit.     PHYSICAL EXAMINATION: ECOG PERFORMANCE STATUS: 0 - Asymptomatic Vitals:   03/21/20 0948  BP: 124/90  Pulse: 85  Resp: 18  Temp: 99.8 F (37.7 C)   Filed Weights   03/21/20 0948  Weight: 169 lb 3.2 oz (76.7 kg)    Physical Exam Constitutional:      General: She is not in acute distress. HENT:     Head: Normocephalic and atraumatic.  Eyes:     General: No scleral icterus. Cardiovascular:     Rate and Rhythm: Normal rate and regular rhythm.     Heart sounds: Normal heart sounds.  Pulmonary:     Effort: Pulmonary effort is normal. No respiratory distress.     Breath sounds: No wheezing.  Abdominal:     General: Bowel sounds are normal. There is no distension.     Palpations: Abdomen is soft.  Musculoskeletal:        General: No deformity. Normal range of motion.     Cervical back: Normal range of motion and neck supple.  Skin:    General: Skin is warm and dry.     Findings: No erythema or rash.  Neurological:     Mental Status: She is alert and oriented to person, place, and time. Mental status is at baseline.     Cranial Nerves: No cranial nerve deficit.     Coordination: Coordination normal.  Psychiatric:        Mood and Affect: Mood normal.      LABORATORY DATA:  I have reviewed the data as listed Lab Results  Component Value Date   WBC 10.1 03/21/2020   HGB 12.4 03/21/2020   HCT 36.3 03/21/2020   MCV 85.2 03/21/2020   PLT 485 (H) 03/21/2020   Recent Labs    03/21/20 1024  NA 135  K 3.8  CL 101  CO2 21*  GLUCOSE 90  BUN 13  CREATININE 0.65  CALCIUM 9.2  GFRNONAA >60  PROT 7.9  ALBUMIN 4.3  AST 21  ALT 22  ALKPHOS 59  BILITOT 0.6    Iron/TIBC/Ferritin/ %Sat    Component Value Date/Time   IRON 54 03/21/2020 1024   TIBC 430 03/21/2020 1024   FERRITIN 24 03/21/2020 1024   FERRITIN 33 03/02/2020 1612   IRONPCTSAT 13 03/21/2020 1024      RADIOGRAPHIC STUDIES: I have personally reviewed the radiological images as listed and agreed with the findings in the report. No results found.    ASSESSMENT & PLAN:  1. Thrombocytosis   2. Family history of breast cancer    I discussed with patient that the differential diagnosis of the thrombosis is broad, including benign etiology such as reactive to surgery, trauma, infection, nutrition deficiency, etc, as well as malignant etiology including underlying bone marrow disorders.   For the work up of patient's thrombocytosis,  I recommend checking CBC;CMP, LDH, pathology smear review, iron,TIBC, ferritin and JAK 2 mutation,  BCR ABL; MPL; CALR mutation.  Check ANA and CRP.  LDH, reticulocyte panel Family history of breast cancer, we discussed about recommendation of genetic testing.  She will consider and update me in future visits  Orders Placed This Encounter  Procedures  . Ferritin    Standing Status:   Future    Number of Occurrences:   1    Standing Expiration Date:   09/21/2020  . CBC with Differential/Platelet    Standing Status:   Future    Number of Occurrences:   1    Standing Expiration Date:   03/21/2021  . JAK2 V617F, w Reflex to CALR/E12/MPL    Standing Status:   Future    Number of Occurrences:   1    Standing Expiration Date:   03/21/2021  . BCR-ABL1 FISH    Standing Status:   Future    Number of Occurrences:   1    Standing Expiration Date:   03/21/2021  . Comprehensive metabolic panel    Standing Status:   Future    Number of Occurrences:   1    Standing Expiration Date:   03/21/2021  . Pathologist smear review    Standing Status:   Future    Number of Occurrences:   1    Standing Expiration Date:   03/21/2021  . ANA w/Reflex    Standing Status:    Future    Number of Occurrences:   1    Standing Expiration Date:   03/21/2021  . C-reactive protein    Standing Status:   Future    Number of Occurrences:   1    Standing Expiration Date:   03/21/2021  . HIV Antibody (routine testing w rflx)    Standing Status:   Future    Number of Occurrences:   1    Standing Expiration Date:   03/21/2021  . Lactate dehydrogenase    Standing Status:   Future    Number of Occurrences:   1    Standing Expiration Date:   03/21/2021  . Iron and TIBC    Standing Status:   Future    Number of Occurrences:   1    Standing Expiration Date:   03/21/2021  . Retic Panel    Standing Status:   Future    Number of Occurrences:   1    Standing Expiration Date:   03/21/2021    All questions were answered. The patient knows to call the clinic with any problems questions or concerns.  Cc Lawhorn, Lisbeth Ply*  Return of visit: 2 weeks Thank you for this kind referral and the opportunity to participate in the care of this patient. A copy of today's note is routed to referring provider     Rickard Patience, MD, PhD 03/21/2020

## 2020-03-22 LAB — ANA W/REFLEX: Anti Nuclear Antibody (ANA): NEGATIVE

## 2020-03-24 LAB — BCR-ABL1 FISH
Cells Analyzed: 200
Cells Counted: 200

## 2020-03-30 LAB — CALR + JAK2 E12-15 + MPL (REFLEXED)

## 2020-03-30 LAB — JAK2 V617F, W REFLEX TO CALR/E12/MPL

## 2020-04-04 ENCOUNTER — Inpatient Hospital Stay (HOSPITAL_BASED_OUTPATIENT_CLINIC_OR_DEPARTMENT_OTHER): Payer: BC Managed Care – PPO | Admitting: Oncology

## 2020-04-04 ENCOUNTER — Encounter: Payer: Self-pay | Admitting: Oncology

## 2020-04-04 DIAGNOSIS — D75839 Thrombocytosis, unspecified: Secondary | ICD-10-CM

## 2020-04-04 DIAGNOSIS — R7982 Elevated C-reactive protein (CRP): Secondary | ICD-10-CM

## 2020-04-04 DIAGNOSIS — Z803 Family history of malignant neoplasm of breast: Secondary | ICD-10-CM

## 2020-04-04 NOTE — Progress Notes (Signed)
HEMATOLOGY-ONCOLOGY TeleHEALTH VISIT PROGRESS NOTE  I connected with Sarah Blankenship on 04/04/20  at  2:00 PM EDT by video enabled telemedicine visit and verified that I am speaking with the correct person using two identifiers. I discussed the limitations, risks, security and privacy concerns of performing an evaluation and management service by telemedicine and the availability of in-person appointments. The patient expressed understanding and agreed to proceed.   Other persons participating in the visit and their role in the encounter:  None  Patient's location: Home  Provider's location: office Chief Complaint:    INTERVAL HISTORY Sarah Blankenship is a 25 y.o. female who has above history reviewed by me today presents for follow up visit for management of thrombocytosis  Problems and complaints are listed below:  Patient had blood work done since last visit. Continues to feel very tired. Review of Systems  Constitutional: Positive for fatigue. Negative for appetite change, chills and fever.  HENT:   Negative for hearing loss and voice change.   Eyes: Negative for eye problems.  Respiratory: Negative for chest tightness and cough.   Cardiovascular: Negative for chest pain.  Gastrointestinal: Negative for abdominal distention, abdominal pain and blood in stool.  Endocrine: Negative for hot flashes.  Genitourinary: Negative for difficulty urinating and frequency.   Musculoskeletal: Negative for arthralgias.  Skin: Negative for itching and rash.  Neurological: Negative for extremity weakness.  Hematological: Negative for adenopathy.  Psychiatric/Behavioral: Negative for confusion.    Past Medical History:  Diagnosis Date  . Anemia   . Seasonal allergies   . Tachycardia    Past Surgical History:  Procedure Laterality Date  . MULTIPLE TOOTH EXTRACTIONS    . NASAL FRACTURE SURGERY      Family History  Problem Relation Age of Onset  . Thyroid disease Paternal Grandmother    . Diabetes Maternal Grandmother   . Breast cancer Maternal Grandmother   . Diabetes Maternal Grandfather   . Diabetes Mother   . Hypertension Mother   . High Cholesterol Mother   . Breast cancer Maternal Aunt   . Breast cancer Maternal Aunt   . Breast cancer Cousin   . Ovarian cancer Neg Hx   . Colon cancer Neg Hx     Social History   Socioeconomic History  . Marital status: Married    Spouse name: Hessie Diener  . Number of children: Not on file  . Years of education: Not on file  . Highest education level: Not on file  Occupational History  . Not on file  Tobacco Use  . Smoking status: Never Smoker  . Smokeless tobacco: Never Used  Vaping Use  . Vaping Use: Never used  Substance and Sexual Activity  . Alcohol use: No  . Drug use: No  . Sexual activity: Yes    Birth control/protection: None  Other Topics Concern  . Not on file  Social History Narrative  . Not on file   Social Determinants of Health   Financial Resource Strain: Not on file  Food Insecurity: Not on file  Transportation Needs: Not on file  Physical Activity: Not on file  Stress: Not on file  Social Connections: Not on file  Intimate Partner Violence: Not on file    Current Outpatient Medications on File Prior to Visit  Medication Sig Dispense Refill  . cetirizine (ZYRTEC) 10 MG tablet Take 10 mg by mouth daily.    . Vitamin D, Ergocalciferol, (DRISDOL) 1.25 MG (50000 UNIT) CAPS capsule Take 1 capsule (50,000  Units total) by mouth every 7 (seven) days. 14 capsule 1   No current facility-administered medications on file prior to visit.    Allergies  Allergen Reactions  . Cefzil [Cefprozil] Rash       Observations/Objective: There were no vitals filed for this visit. There is no height or weight on file to calculate BMI.  Physical Exam  CBC    Component Value Date/Time   WBC 10.1 03/21/2020 1024   RBC 4.37 03/21/2020 1024   RBC 4.26 03/21/2020 1024   HGB 12.4 03/21/2020 1024   HGB 12.6  03/02/2020 1612   HCT 36.3 03/21/2020 1024   HCT 37.7 03/02/2020 1612   PLT 485 (H) 03/21/2020 1024   PLT 555 (H) 03/02/2020 1612   MCV 85.2 03/21/2020 1024   MCV 87 03/02/2020 1612   MCH 29.1 03/21/2020 1024   MCHC 34.2 03/21/2020 1024   RDW 13.1 03/21/2020 1024   RDW 12.6 03/02/2020 1612   LYMPHSABS 2.3 03/21/2020 1024   MONOABS 0.6 03/21/2020 1024   EOSABS 0.1 03/21/2020 1024   BASOSABS 0.1 03/21/2020 1024    CMP     Component Value Date/Time   NA 135 03/21/2020 1024   NA 137 05/22/2018 0950   K 3.8 03/21/2020 1024   CL 101 03/21/2020 1024   CO2 21 (L) 03/21/2020 1024   GLUCOSE 90 03/21/2020 1024   BUN 13 03/21/2020 1024   BUN 9 05/22/2018 0950   CREATININE 0.65 03/21/2020 1024   CALCIUM 9.2 03/21/2020 1024   PROT 7.9 03/21/2020 1024   PROT 6.2 05/22/2018 0950   ALBUMIN 4.3 03/21/2020 1024   ALBUMIN 3.3 (L) 05/22/2018 0950   AST 21 03/21/2020 1024   ALT 22 03/21/2020 1024   ALKPHOS 59 03/21/2020 1024   BILITOT 0.6 03/21/2020 1024   BILITOT <0.2 05/22/2018 0950   GFRNONAA >60 03/21/2020 1024   GFRAA >60 05/28/2018 0549     Assessment and Plan: 1. Thrombocytosis   2. Family history of breast cancer   3. CRP elevated     #Thrombocytosis, Labs reviewed and discussed with patient Chronic elevated platelet counts since July 2020, improved from previous visits. JAK2 V617F mutation negative, with reflex to other mutations CALR, MPL, JAK 2 Ex 12-15 mutations negative. BCR ABL 1 FISH negative.  Most likely thrombocytosis is reactive. Etiology includes iron deficiency, increased chronic inflammation etc.  Iron panel showed normal ferritin, borderline iron deficiency.  Recommend patient to start a trial of oral iron supplementation over-the-counter-ferrous sulfate 325 mg daily.  Repeat CBC in 3 months. Increased CRP, patient does not have chronic arthralgia, skin rash.  She has family history of hyperlipidemia. Recommend patient to further discuss with primary care  doctor for evaluation of cholesterol level and other cardiovascular risk.  Family history of breast cancer Refer to genetic testing.     Follow Up Instructions: 3 months   I discussed the assessment and treatment plan with the patient. The patient was provided an opportunity to ask questions and all were answered. The patient agreed with the plan and demonstrated an understanding of the instructions.  The patient was advised to call back or seek an in-person evaluation if the symptoms worsen or if the condition fails to improve as anticipated.   Rickard Patience, MD 04/04/2020 7:42 PM

## 2020-04-04 NOTE — Progress Notes (Signed)
Pt contacted for Mychart visit. Pt reports she feels very tired despite sleeping 8 hours/ night.

## 2020-04-05 ENCOUNTER — Other Ambulatory Visit: Payer: Self-pay | Admitting: Certified Nurse Midwife

## 2020-04-05 ENCOUNTER — Other Ambulatory Visit: Payer: Self-pay

## 2020-04-05 DIAGNOSIS — Z13220 Encounter for screening for lipoid disorders: Secondary | ICD-10-CM

## 2020-04-05 DIAGNOSIS — Z803 Family history of malignant neoplasm of breast: Secondary | ICD-10-CM

## 2020-04-05 DIAGNOSIS — D691 Qualitative platelet defects: Secondary | ICD-10-CM

## 2020-04-05 NOTE — Progress Notes (Signed)
Lipid panel ordered per Dr. Cathie Hoops, see chart.    Sarah Blankenship, CNM [facility], [facility] 04/05/20 9:35 AM

## 2020-05-02 ENCOUNTER — Encounter: Payer: Self-pay | Admitting: Licensed Clinical Social Worker

## 2020-05-02 ENCOUNTER — Inpatient Hospital Stay: Payer: BC Managed Care – PPO

## 2020-05-02 ENCOUNTER — Inpatient Hospital Stay: Payer: BC Managed Care – PPO | Attending: Oncology | Admitting: Licensed Clinical Social Worker

## 2020-05-02 DIAGNOSIS — Z800 Family history of malignant neoplasm of digestive organs: Secondary | ICD-10-CM | POA: Insufficient documentation

## 2020-05-02 DIAGNOSIS — Z803 Family history of malignant neoplasm of breast: Secondary | ICD-10-CM | POA: Insufficient documentation

## 2020-05-02 DIAGNOSIS — Z1501 Genetic susceptibility to malignant neoplasm of breast: Secondary | ICD-10-CM | POA: Diagnosis not present

## 2020-05-02 NOTE — Progress Notes (Signed)
REFERRING PROVIDER: Earlie Server, MD [place],  [place] [address]  PRIMARY PROVIDER:  Pllc, [facility]  PRIMARY REASON FOR VISIT:  1. Family history of breast cancer   2. Family history of colon cancer      HISTORY OF PRESENT ILLNESS:   Sarah Blankenship, a 25 y.o. female, was seen for a [facility] cancer genetics consultation at the request of Dr. Tasia Catchings due to a family history of breast cancer.  [name] presents to clinic today to discuss the possibility of a hereditary predisposition to cancer, genetic testing, and to further clarify her future cancer risks, as well as potential cancer risks for family members.   [name] is a 25 y.o. female with no personal history of cancer.    CANCER HISTORY:  Oncology History   No history exists.     RISK FACTORS:  Menarche was at age 17.  First live birth at age 63.  OCP use for few months Ovaries intact: yes.  Hysterectomy: no.  Menopausal status: premenopausal.  HRT use: 0 years. Colonoscopy: no; not examined. Mammogram within the last year: no. Number of breast biopsies: 0. Up to date with pelvic exams: yes. Any excessive radiation exposure in the past: no  Past Medical History:  Diagnosis Date  . Anemia   . Family history of breast cancer   . Family history of colon cancer   . Seasonal allergies   . Tachycardia     Past Surgical History:  Procedure Laterality Date  . MULTIPLE TOOTH EXTRACTIONS    . NASAL FRACTURE SURGERY      Social History   Socioeconomic History  . Marital status: Married    Spouse name: Antony Haste  . Number of children: Not on file  . Years of education: Not on file  . Highest education level: Not on file  Occupational History  . Not on file  Tobacco Use  . Smoking status: Never Smoker  . Smokeless tobacco: Never Used  Vaping Use  . Vaping Use: Never used  Substance and Sexual Activity  . Alcohol use: No  . Drug use: No  . Sexual activity: Yes    Birth  control/protection: None  Other Topics Concern  . Not on file  Social History Narrative  . Not on file   Social Determinants of Health   Financial Resource Strain: Not on file  Food Insecurity: Not on file  Transportation Needs: Not on file  Physical Activity: Not on file  Stress: Not on file  Social Connections: Not on file     FAMILY HISTORY:  We obtained a detailed, 4-generation family history.  Significant diagnoses are listed below: Family History  Problem Relation Age of Onset  . Thyroid disease Paternal Grandmother   . Diabetes Maternal Grandmother   . Breast cancer Maternal Grandmother        dx 50s-60s  . Diabetes Maternal Grandfather   . Diabetes Mother   . Hypertension Mother   . High Cholesterol Mother   . Colon cancer Paternal Uncle        dx 2s  . Breast cancer Other   . Colon cancer Other   . Colon cancer Maternal Great-grandfather   . Bone cancer Other   . Ovarian cancer Neg Hx    [name] does not have siblings. She has 1 daughter, age 73.   [name]'s mother is living at 104. Patient has 2 maternal uncles and 1 maternal aunt, no cancers. Maternal grandfather  is in his 71s. Grandmother is also in her 63s, she had breast cancer in her 50s-60s. She also had 2 sisters who had breast cancer and passed from it and a sister who had colon cancer twice. Her father, patient's great grandfather, had colon cancer as well.   [name] father is living at 54. He had a brother who had colon cancer in his 24s and passed in his 54s. Patient's paternal grandfather died of cirrhosis, grandmother is in her 47s and had a brother who passed from bone cancer.   [name] is unaware of previous family history of genetic testing for hereditary cancer risks. Patient's maternal ancestors are of [place], [place], [place] descent, and paternal ancestors are of [place], [place], [place] descent. There is no reported Ashkenazi Jewish ancestry. There is no known  consanguinity.    GENETIC COUNSELING ASSESSMENT: [name] is a 25 y.o. female with a family history of breast and colon cancer which is somewhat suggestive of a hereditary cancer syndrome and predisposition to cancer. We, therefore, discussed and recommended the following at today's visit.   DISCUSSION: We discussed that approximately 5-10% of breast cancer is hereditary  Most cases of hereditary breast cancer are associated with BRCA1/BRCA2 genes, although there are other genes associated with hereditary breast cancer as well including CHEK2, which is also associated with colon cancer. There are other genes we can look at that are associated with other types of cancers, cancers and risks are gene specific.  We discussed that testing is beneficial for several reasons including  knowing about other cancer risks, identifying potential screening and risk-reduction options that may be appropriate, and to understand if other family members could be at risk for cancer and allow them to undergo genetic testing.   We reviewed the characteristics, features and inheritance patterns of hereditary cancer syndromes. We also discussed genetic testing, including the appropriate family members to test, the process of testing, insurance coverage and turn-around-time for results. We discussed the implications of a negative, positive and/or variant of uncertain significant result. We recommended Ms. Lacasse pursue genetic testing for the [facility] CancerNext-Expanded+RNA panel.   Based on Ms. Weiand's family history of cancer, she meets medical criteria for genetic testing. Despite that she meets criteria, she may still have an out of pocket cost. We discussed that if her out of pocket cost for testing is over $100, the laboratory will call and confirm whether she wants to proceed with testing.  If the out of pocket cost of testing is less than $100 she will be billed by the genetic testing laboratory.   We discussed that some  people do not want to undergo genetic testing due to fear of genetic discrimination.  A federal law called the Genetic Information Non-Discrimination Act (GINA) of 2008 helps protect individuals against genetic discrimination based on their genetic test results.  It impacts both health insurance and employment.  For health insurance, it protects against increased premiums, being kicked off insurance or being forced to take a test in order to be insured.  For employment it protects against hiring, firing and promoting decisions based on genetic test results.  Health status due to a cancer diagnosis is not protected under GINA.  This law does not protect life insurance, disability insurance, or other types of insurance.   PLAN: After considering the risks, benefits, and limitations, Ms. Paulhus provided informed consent to pursue genetic testing and the blood sample was sent to [facility] for analysis of the CancerNext-Expanded+RNA panel.  Results should be available within approximately 2-3 weeks' time, at which point they will be disclosed by telephone to Ms. Akkerman, as will any additional recommendations warranted by these results. Ms. Vanmetre will receive a summary of her genetic counseling visit and a copy of her results once available. This information will also be available in Epic.   Ms. Flanagin questions were answered to her satisfaction today. Our contact information was provided should additional questions or concerns arise. Thank you for the referral and allowing [place] to share in the care of your patient.   Faith Rogue, MS, [facility] Genetic Counselor [email or web address] Phone: [phone]  The patient was seen for a total of 25 minutes in face-to-face genetic counseling.  Dr. Grayland Ormond was available for discussion regarding this case.   _______________________________________________________________________ For Office Staff:  Number of people involved in session: 1 Was an Intern/  student involved with case: no

## 2020-05-16 ENCOUNTER — Other Ambulatory Visit: Payer: Self-pay

## 2020-05-16 ENCOUNTER — Ambulatory Visit
Admission: RE | Admit: 2020-05-16 | Discharge: 2020-05-16 | Disposition: A | Discharge status: Home / Self Care | Payer: BC Managed Care – PPO | Source: Ambulatory Visit | Attending: Family Medicine | Admitting: Family Medicine

## 2020-05-16 VITALS — BP 121/86 | HR 91 | Temp 98.4°F | Resp 18

## 2020-05-16 DIAGNOSIS — B349 Viral infection, unspecified: Secondary | ICD-10-CM

## 2020-05-16 DIAGNOSIS — J029 Acute pharyngitis, unspecified: Secondary | ICD-10-CM | POA: Diagnosis not present

## 2020-05-16 LAB — POCT RAPID STREP A (OFFICE): Rapid Strep A Screen: NEGATIVE

## 2020-05-16 MED ORDER — GUAIFENESIN-CODEINE 100-10 MG/5ML PO SYRP: 5.0000 mL | ORAL_SOLUTION | Freq: Three times a day (TID) | ORAL | 0 refills | Status: DC | PRN

## 2020-05-16 MED ORDER — PREDNISONE 10 MG (21) PO TBPK: ORAL_TABLET | Freq: Every day | ORAL | 0 refills | Status: AC

## 2020-05-16 NOTE — ED Triage Notes (Signed)
Pt presents with c/o cold symptoms for past week and then developed sore throat yesterday , covid test negative

## 2020-05-16 NOTE — ED Provider Notes (Signed)
[facility]    CSN: [id number] Arrival date & time: 05/16/20  0951      History   Chief Complaint Chief Complaint  Patient presents with  . Nasal Congestion  . Sore Throat    HPI Sarah Blankenship is a 25 y.o. female.   Reports cough, nasal congestion for the last week. Reports that cough is worse at night. Reports sore throat since yesterday. Has taken OTC cough and cold with little relief. Has negative hx Covid. Has not completed Covid vaccines. Has completed flu vaccine. Denies headache, body aches, sick contacts, abdominal pain, nausea, vomiting, diarrhea, rash, fever, other symptoms.   ROS per HPI   The history is provided by the patient.  Sore Throat    Past Medical History:  Diagnosis Date  . Anemia   . Family history of breast cancer   . Family history of colon cancer   . Seasonal allergies   . Tachycardia     Patient Active Problem List   Diagnosis Date Noted  . Family history of breast cancer   . Family history of colon cancer   . Vitamin D deficiency 07/22/2018  . Gestational hypertension 05/22/2018    Past Surgical History:  Procedure Laterality Date  . MULTIPLE TOOTH EXTRACTIONS    . NASAL FRACTURE SURGERY      OB History    Gravida  1   Para  1   Term  1   Preterm  0   AB  0   Living  1     SAB  0   IAB  0   Ectopic  0   Multiple  0   Live Births  1            Home Medications    Prior to Admission medications   Medication Sig Start Date End Date Taking? Authorizing Provider  guaiFENesin-codeine (ROBITUSSIN AC) 100-10 MG/5ML syrup Take 5 mLs by mouth 3 (three) times daily as needed for cough. 05/16/20  Yes Moshe Cipro, NP  predniSONE (STERAPRED UNI-PAK 21 TAB) 10 MG (21) TBPK tablet Take by mouth daily for 6 days. Take 6 tablets on day 1, 5 tablets on day 2, 4 tablets on day 3, 3 tablets on day 4, 2 tablets on day 5, 1 tablet on day 6 05/16/20 05/22/20 Yes Moshe Cipro, NP  cetirizine  (ZYRTEC) 10 MG tablet Take 10 mg by mouth daily.    [provider]  Vitamin D, Ergocalciferol, (DRISDOL) 1.25 MG (50000 UNIT) CAPS capsule Take 1 capsule (50,000 Units total) by mouth every 7 (seven) days. 01/28/20   Lawhorn, Vanessa [place], CNM    Family History Family History  Problem Relation Age of Onset  . Thyroid disease Paternal Grandmother   . Diabetes Maternal Grandmother   . Breast cancer Maternal Grandmother        dx 50s-60s  . Diabetes Maternal Grandfather   . Diabetes Mother   . Hypertension Mother   . High Cholesterol Mother   . Colon cancer Paternal Uncle        dx 73s  . Breast cancer Other   . Colon cancer Other   . Colon cancer Maternal Great-grandfather   . Bone cancer Other   . Ovarian cancer Neg Hx     Social History Social History   Tobacco Use  . Smoking status: Never Smoker  . Smokeless tobacco: Never Used  Vaping Use  . Vaping Use: Never used  Substance Use Topics  .  Alcohol use: No  . Drug use: No     Allergies   Cefzil [cefprozil]   Review of Systems Review of Systems   Physical Exam Triage Vital Signs ED Triage Vitals  Enc Vitals Group     BP 05/16/20 1019 121/86     Pulse Rate 05/16/20 1019 91     Resp 05/16/20 1019 18     Temp 05/16/20 1019 98.4 F (36.9 C)     Temp src --      SpO2 05/16/20 1019 97 %     Weight --      Height --      Head Circumference --      Peak Flow --      Pain Score 05/16/20 1017 8     Pain Loc --      Pain Edu? --      Excl. in GC? --    No data found.  Updated Vital Signs BP 121/86   Pulse 91   Temp 98.4 F (36.9 C)   Resp 18   LMP 04/24/2020   SpO2 97%     Physical Exam Vitals and nursing note reviewed.  Constitutional:      General: She is not in acute distress.    Appearance: Normal appearance. She is well-developed and normal weight. She is not ill-appearing.  HENT:     Head: Normocephalic and atraumatic.     Right Ear: Tympanic membrane and ear canal normal.      Left Ear: Tympanic membrane and ear canal normal.     Nose: Congestion present.     Mouth/Throat:     Mouth: Mucous membranes are moist.     Pharynx: Posterior oropharyngeal erythema present.  Eyes:     Extraocular Movements: Extraocular movements intact.     Conjunctiva/sclera: Conjunctivae normal.     Pupils: Pupils are equal, round, and reactive to light.  Cardiovascular:     Rate and Rhythm: Normal rate and regular rhythm.     Heart sounds: Normal heart sounds. No murmur heard.   Pulmonary:     Effort: Pulmonary effort is normal. No respiratory distress.     Breath sounds: No stridor. Wheezing present. No rhonchi or rales.  Chest:     Chest wall: No tenderness.  Abdominal:     General: Bowel sounds are normal.     Palpations: Abdomen is soft.     Tenderness: There is no abdominal tenderness.  Musculoskeletal:        General: Normal range of motion.     Cervical back: Normal range of motion and neck supple.  Lymphadenopathy:     Cervical: Cervical adenopathy present.  Skin:    General: Skin is warm and dry.  Neurological:     General: No focal deficit present.     Mental Status: She is alert and oriented to person, place, and time.  Psychiatric:        Mood and Affect: Mood normal.        Behavior: Behavior normal.        Thought Content: Thought content normal.      UC Treatments / Results  Labs (all labs ordered are listed, but only abnormal results are displayed) Labs Reviewed  COVID-19, FLU A+B NAA   Narrative:    Test(s) 140142-Influenza A, NAA; 140143-Influenza B, NAA was developed and its performance characteristics determined by [facility]. It has not been cleared or approved by the Food and Drug Administration. Performed at:  01 -  [facility] [address], [place], [place]  [id number] Lab Director: Jolene Schimke MD, Phone:  [phone]  CULTURE, GROUP A STREP [facility])  POCT RAPID STREP A (OFFICE)    EKG   Radiology No results  found.  Procedures Procedures (including critical care time)  Medications Ordered in UC Medications - No data to display  Initial Impression / Assessment and Plan / UC Course  I have reviewed the triage vital signs and the nursing notes.  Pertinent labs & imaging results that were available during my care of the patient were reviewed by me and considered in my medical decision making (see chart for details).    Viral illness Sore throat  Your rapid strep test is negative.  A throat culture is pending; we will call you if it is positive requiring treatment.   Prescribed steroid taper Prescribed cheratussin Sedation precautions given Covid and flu swab obtained in office today.   Work note provided Patient instructed to quarantine until results are back and negative.   If results are negative, patient may resume daily schedule as tolerated once they are fever free for 24 hours without the use of antipyretic medications.   If results are positive, patient instructed to quarantine for at least 5 days from symptom onset.  If after 5 days symptoms have resolved, may return to work with a well fitting mask for the next 5 days. If symptomatic after day 5, isolation should be extended to 10 days. Patient instructed to follow-up with primary care or with this office as needed.   Patient instructed to follow-up in the ER for trouble swallowing, trouble breathing, other concerning symptoms.   Final Clinical Impressions(s) / UC Diagnoses   Final diagnoses:  Viral illness  Sore throat     Discharge Instructions     I have sent in a prednisone taper for you to take for 6 days. 6 tablets on day one, 5 tablets on day two, 4 tablets on day three, 3 tablets on day four, 2 tablets on day five, and 1 tablet on day six.  I have sent in cough syrup for you to take. This medication can make you sleepy. Do not drive while taking this medication.  Your COVID and Influenza tests are pending.  You  should self quarantine until the test results are back.    Take Tylenol or ibuprofen as needed for fever or discomfort.  Rest and keep yourself hydrated.    Follow-up with your primary care provider if your symptoms are not improving.        ED Prescriptions    Medication Sig Dispense Auth. Provider   predniSONE (STERAPRED UNI-PAK 21 TAB) 10 MG (21) TBPK tablet Take by mouth daily for 6 days. Take 6 tablets on day 1, 5 tablets on day 2, 4 tablets on day 3, 3 tablets on day 4, 2 tablets on day 5, 1 tablet on day 6 21 tablet Moshe Cipro, NP   guaiFENesin-codeine (ROBITUSSIN AC) 100-10 MG/5ML syrup Take 5 mLs by mouth 3 (three) times daily as needed for cough. 120 mL Moshe Cipro, NP     PDMP not reviewed this encounter.   Moshe Cipro, NP 05/18/20 1733

## 2020-05-16 NOTE — Discharge Instructions (Signed)
I have sent in a prednisone taper for you to take for 6 days. 6 tablets on day one, 5 tablets on day two, 4 tablets on day three, 3 tablets on day four, 2 tablets on day five, and 1 tablet on day six.  I have sent in cough syrup for you to take. This medication can make you sleepy. Do not drive while taking this medication.  Your COVID and Influenza tests are pending.  You should self quarantine until the test results are back.    Take Tylenol or ibuprofen as needed for fever or discomfort.  Rest and keep yourself hydrated.    Follow-up with your primary care provider if your symptoms are not improving.     

## 2020-05-18 LAB — COVID-19, FLU A+B NAA
Influenza A, NAA: NOT DETECTED
Influenza B, NAA: NOT DETECTED
SARS-CoV-2, NAA: NOT DETECTED

## 2020-05-19 LAB — CULTURE, GROUP A STREP (THRC)

## 2020-05-23 ENCOUNTER — Encounter: Payer: Self-pay | Admitting: Licensed Clinical Social Worker

## 2020-05-23 ENCOUNTER — Ambulatory Visit: Payer: Self-pay | Admitting: Licensed Clinical Social Worker

## 2020-05-23 ENCOUNTER — Telehealth: Payer: Self-pay | Admitting: Licensed Clinical Social Worker

## 2020-05-23 DIAGNOSIS — Z1379 Encounter for other screening for genetic and chromosomal anomalies: Secondary | ICD-10-CM | POA: Insufficient documentation

## 2020-05-23 DIAGNOSIS — Z800 Family history of malignant neoplasm of digestive organs: Secondary | ICD-10-CM

## 2020-05-23 DIAGNOSIS — Z803 Family history of malignant neoplasm of breast: Secondary | ICD-10-CM

## 2020-05-23 NOTE — Telephone Encounter (Signed)
Revealed negative genetic testing.  This normal result is reassuring.  It is unlikely that there is an increased risk of cancer due to a mutation in one of these genes.  However, genetic testing is not perfect, and cannot definitively rule out a hereditary cause.  It will be important for her to keep in contact with genetics to learn if any additional testing may be needed in the future.      

## 2020-05-23 NOTE — Progress Notes (Signed)
HPI:  Ms. Stalker was previously seen in the [facility] due to a family history of cancer and concerns regarding a hereditary predisposition to cancer. Please refer to our prior cancer genetics clinic note for more information regarding our discussion, assessment and recommendations, at the time. Ms. Tramontana recent genetic test results were disclosed to her, as were recommendations warranted by these results. These results and recommendations are discussed in more detail below.  CANCER HISTORY:  Oncology History   No history exists.    FAMILY HISTORY:  We obtained a detailed, 4-generation family history.  Significant diagnoses are listed below: Family History  Problem Relation Age of Onset  . Thyroid disease Paternal Grandmother   . Diabetes Maternal Grandmother   . Breast cancer Maternal Grandmother        dx 50s-60s  . Diabetes Maternal Grandfather   . Diabetes Mother   . Hypertension Mother   . High Cholesterol Mother   . Colon cancer Paternal Uncle        dx 58s  . Breast cancer Other   . Colon cancer Other   . Colon cancer Maternal Great-grandfather   . Bone cancer Other   . Ovarian cancer Neg Hx      Ms. Massimo does not have siblings. She has 1 daughter, age 47.   Ms. Naab's mother is living at 12. Patient has 2 maternal uncles and 1 maternal aunt, no cancers. Maternal grandfather is in his 66s. Grandmother is also in her 20s, she had breast cancer in her 50s-60s. She also had 2 sisters who had breast cancer and passed from it and a sister who had colon cancer twice. Her father, patient's great grandfather, had colon cancer as well.   Ms. Callicott father is living at 1. He had a brother who had colon cancer in his 56s and passed in his 31s. Patient's paternal grandfather died of cirrhosis, grandmother is in her 15s and had a brother who passed from bone cancer.   Ms. Kief is unaware of previous family history of genetic testing for hereditary cancer  risks. Patient's maternal ancestors are of [place], [place], [place] descent, and paternal ancestors are of [place], [place], [place] descent. There is no reported Ashkenazi Jewish ancestry. There is no known consanguinity.   GENETIC TEST RESULTS: Genetic testing reported out on 05/17/2020 through the [employer] CancerNext-Expanded+RNA cancer panel found no pathogenic mutations.   The CancerNext-Expanded + RNAinsight gene panel offered by [employer] and includes sequencing and rearrangement analysis for the following 77 genes: IP, ALK, APC*, ATM*, AXIN2, BAP1, BARD1, BLM, BMPR1A, BRCA1*, BRCA2*, BRIP1*, CDC73, CDH1*,CDK4, CDKN1B, CDKN2A, CHEK2*, CTNNA1, DICER1, FANCC, FH, FLCN, GALNT12, KIF1B, LZTR1, MAX, MEN1, MET, MLH1*, MSH2*, MSH3, MSH6*, MUTYH*, NBN, NF1*, NF2, NTHL1, PALB2*, PHOX2B, PMS2*, POT1, PRKAR1A, PTCH1, PTEN*, RAD51C*, RAD51D*,RB1, RECQL, RET, SDHA, SDHAF2, SDHB, SDHC, SDHD, SMAD4, SMARCA4, SMARCB1, SMARCE1, STK11, SUFU, TMEM127, TP53*,TSC1, TSC2, VHL and XRCC2 (sequencing and deletion/duplication); EGFR, EGLN1, HOXB13, KIT, MITF, PDGFRA, POLD1 and POLE (sequencing only); EPCAM and GREM1 (deletion/duplication only).   The test report has been scanned into EPIC and is located under the Molecular Pathology section of the Results Review tab.  A portion of the result report is included below for reference.     We discussed with Ms. Ellwanger that because current genetic testing is not perfect, it is possible there may be a gene mutation in one of these genes that current testing cannot detect, but that chance is small.  We also discussed, that  there could be another gene that has not yet been discovered, or that we have not yet tested, that is responsible for the cancer diagnoses in the family. It is also possible there is a hereditary cause for the cancer in the family that Ms. Havrilla did not inherit and therefore was not identified in her testing.  Therefore, it is important to remain in touch  with cancer genetics in the future so that we can continue to offer Ms. Gift the most up to date genetic testing.   ADDITIONAL GENETIC TESTING: We discussed with Ms. Bloodsworth that her genetic testing was fairly extensive.  If there are genes identified to increase cancer risk that can be analyzed in the future, we would be happy to discuss and coordinate this testing at that time.    CANCER SCREENING RECOMMENDATIONS: Ms. Stines test result is considered negative (normal).  This means that we have not identified a hereditary cause for her  family history of cancer at this time.   While reassuring, this does not definitively rule out a hereditary predisposition to cancer. It is still possible that there could be genetic mutations that are undetectable by current technology. There could be genetic mutations in genes that have not been tested or identified to increase cancer risk.  Therefore, it is recommended she continue to follow the cancer management and screening guidelines provided by her primary healthcare provider.   An individual's cancer risk and medical management are not determined by genetic test results alone. Overall cancer risk assessment incorporates additional factors, including personal medical history, family history, and any available genetic information that may result in a personalized plan for cancer prevention and surveillance.   Based on Ms. Schaber's personal and family history of cancer as well as her genetic test results, risk model Harriett Rush was used to estimate her risk of developing breast cancer. This estimates her lifetime risk of developing breast cancer to be approximately 14%.  The patient's lifetime breast cancer risk is a preliminary estimate based on available information using one of several models endorsed by the [place] (ACS). The ACS recommends consideration of breast MRI screening as an adjunct to mammography for patients at high risk (defined as 20%  or greater lifetime risk).      RECOMMENDATIONS FOR FAMILY MEMBERS:  Relatives in this family might be at some increased risk of developing cancer, over the general population risk, simply due to the family history of cancer.  We recommended female relatives in this family have a yearly mammogram beginning at age 71, or 77 years younger than the earliest onset of cancer, an annual clinical breast exam, and perform monthly breast self-exams. Female relatives in this family should also have a gynecological exam as recommended by their primary provider.  All family members should be referred for colonoscopy starting at age 15.    It is also possible there is a hereditary cause for the cancer in Ms. Fazekas's family that she did not inherit and therefore was not identified in her.  Based on Ms. Casco's family history, we recommended maternal relatives have genetic counseling and testing. Ms. Teigen will let us know if we can be of any assistance in coordinating genetic counseling and/or testing for these family members.  FOLLOW-UP: Lastly, we discussed with Ms. Ramone that cancer genetics is a rapidly advancing field and it is possible that new genetic tests will be appropriate for her and/or her family members in the future. We encouraged her to remain  in contact with cancer genetics on an annual basis so we can update her personal and family histories and let her know of advances in cancer genetics that may benefit this family.   Our contact number was provided. Ms. Sundby questions were answered to her satisfaction, and she knows she is welcome to call us at anytime with additional questions or concerns.   Faith Rogue, MS, [facility] Genetic Counselor [email or web address] Phone: [phone]

## 2020-07-04 ENCOUNTER — Inpatient Hospital Stay: Payer: BC Managed Care – PPO

## 2020-07-04 ENCOUNTER — Inpatient Hospital Stay: Payer: BC Managed Care – PPO | Admitting: Oncology

## 2020-07-13 DIAGNOSIS — L559 Sunburn, unspecified: Secondary | ICD-10-CM | POA: Insufficient documentation

## 2020-09-09 IMAGING — US OBSTETRIC 14+ WK ULTRASOUND
1 series · 13 of 28 positions shown · non-contrast
Comparison: none

CLINICAL DATA: Scan for anatomy and growth. By assigned EDC of
06/04/2018 the patient is 29 weeks 6 days.

EXAM:
OBSTETRICAL ULTRASOUND >14 WKS

[Series 1: obstetric 14+ wk ultrasound · 0.25mm/px · 13 of 80 slices shown]
[im 3/80]
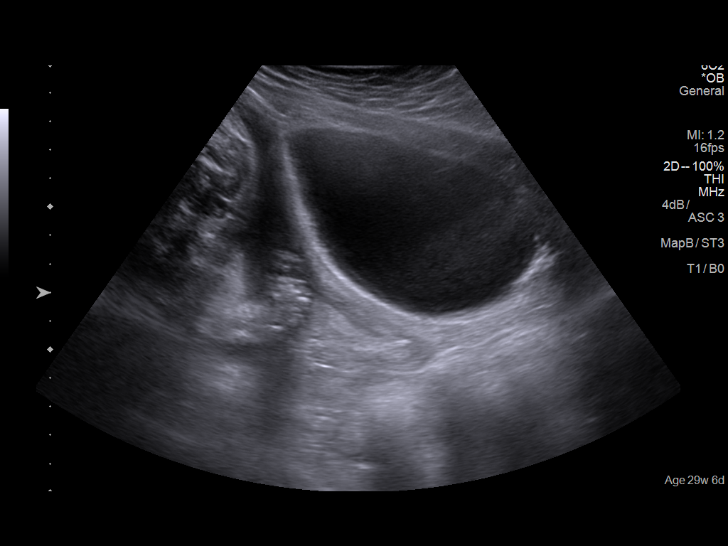
[im 9/80]
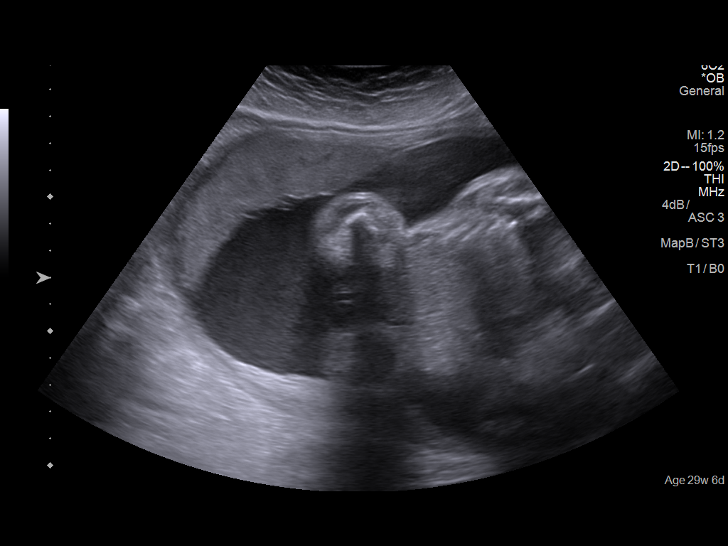
[im 15/80]
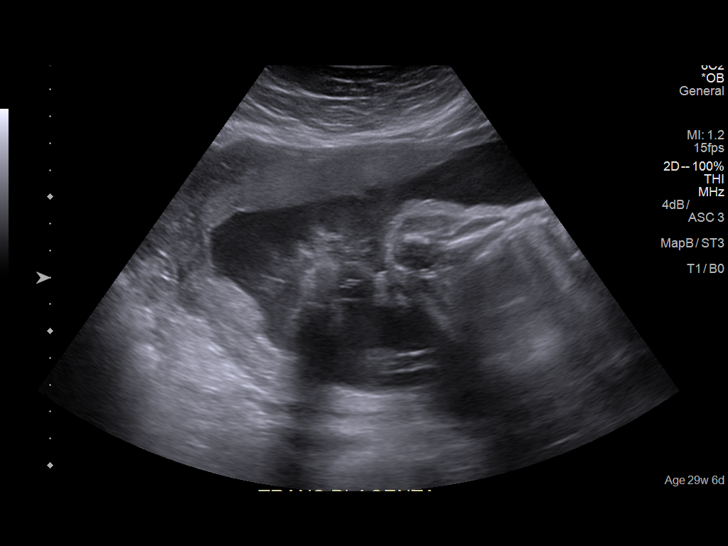
[im 21/80]
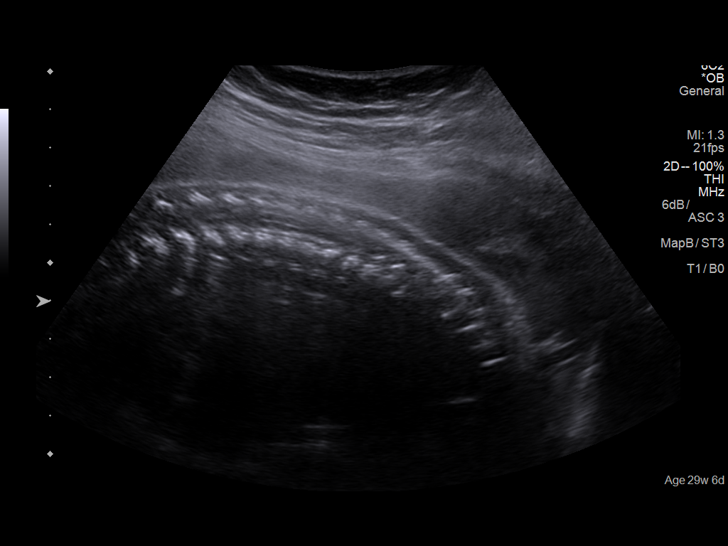
[im 27/80]
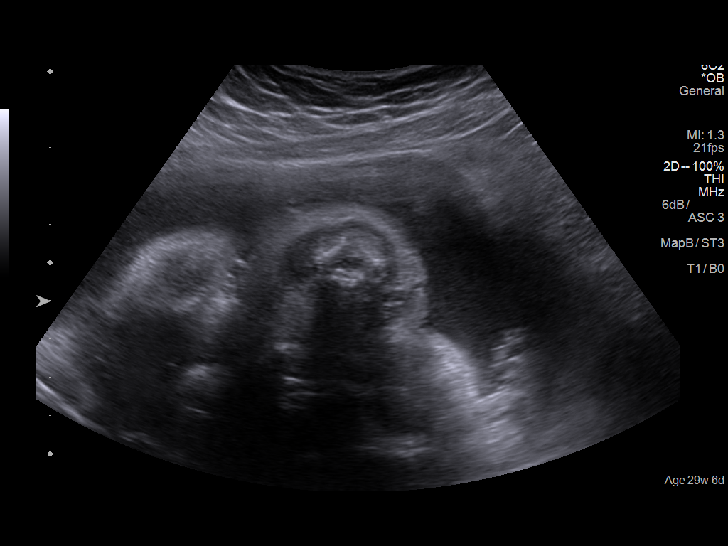
[im 33/80]
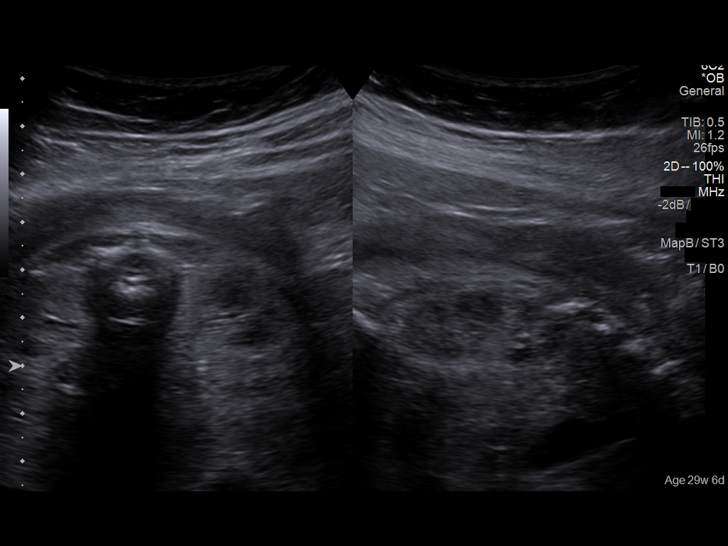
[im 41/80]
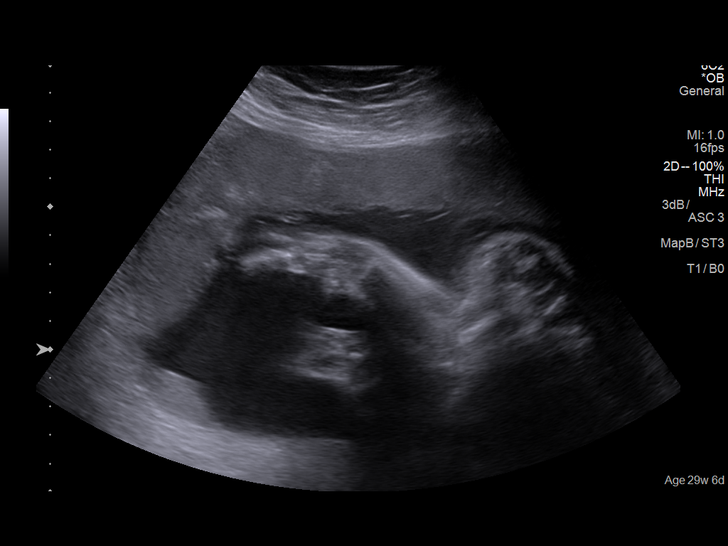
[im 47/80]
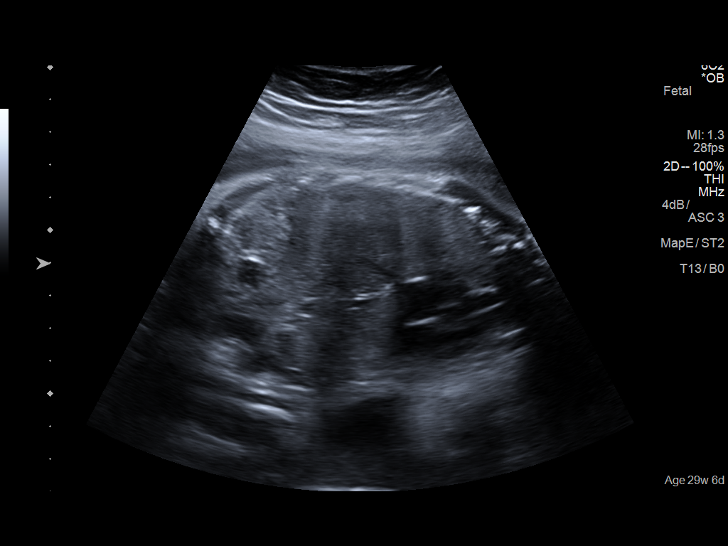
[im 53/80]
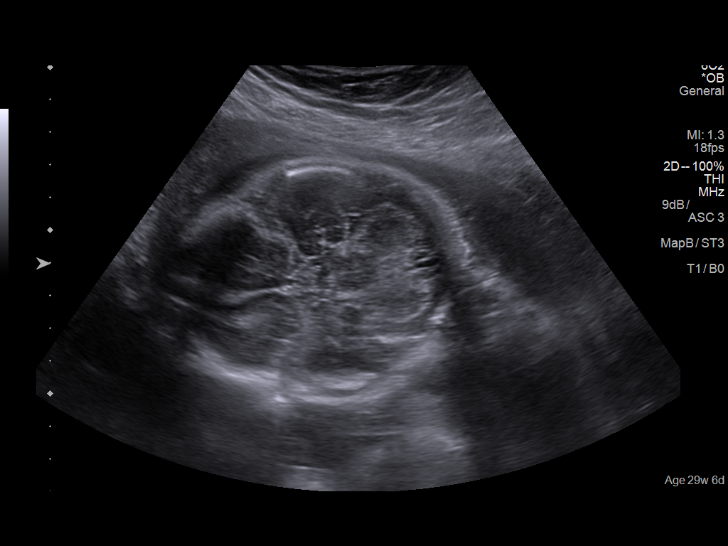
[im 59/80]
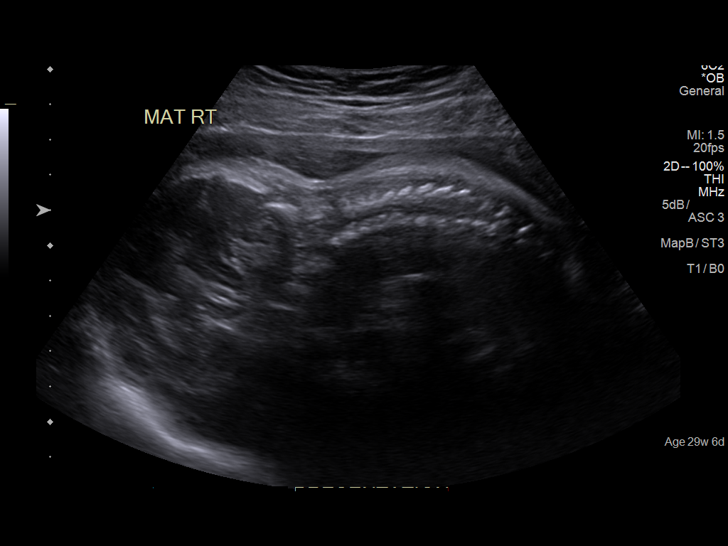
[im 65/80]
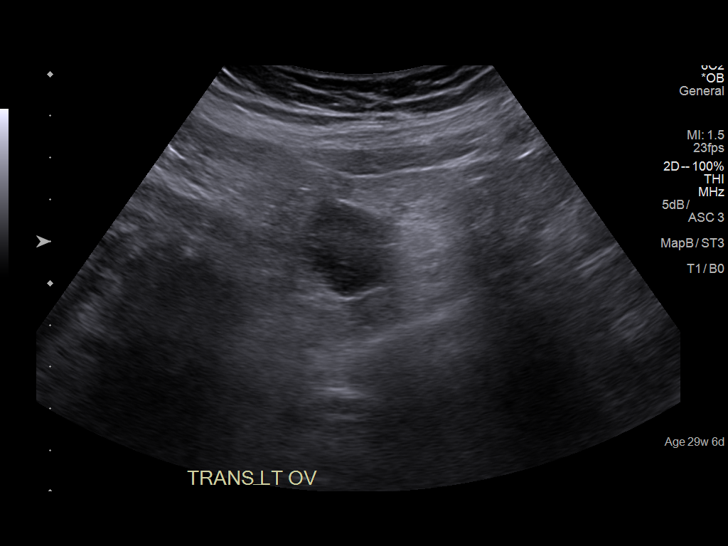
[im 71/80]
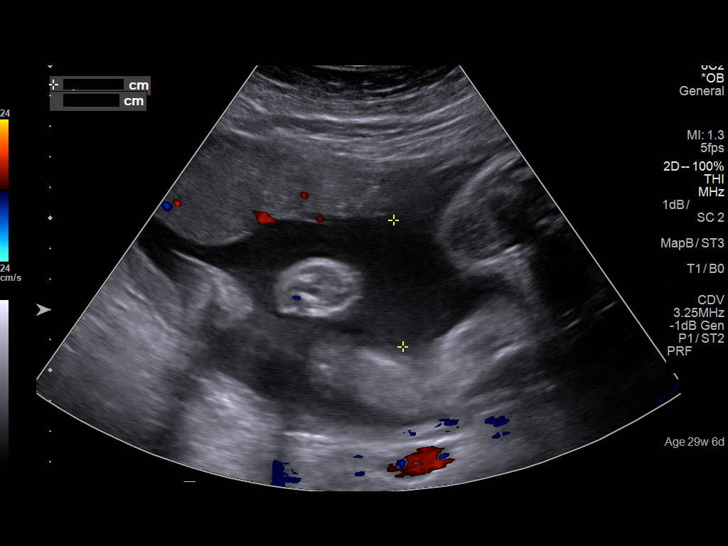
[im 77/80]
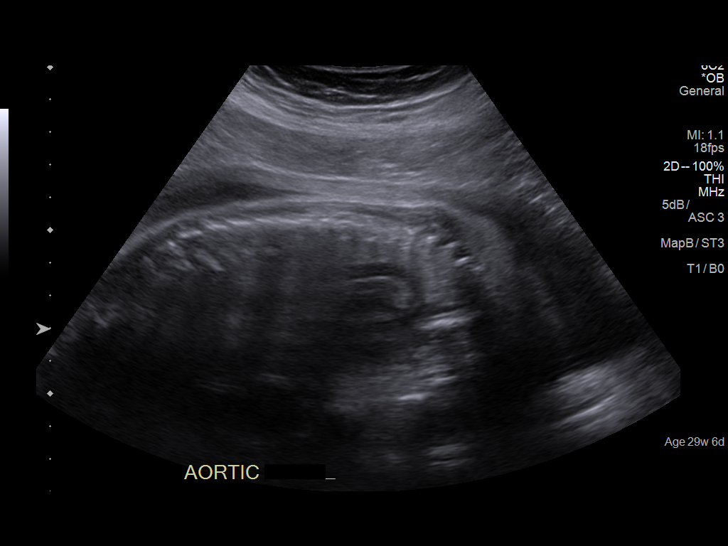

[13 of 28 positions shown; findings below may reference images not displayed]

FINDINGS: Number of Fetuses: 1

Heart Rate:  140 bpm

Movement: Present

Presentation: Transverse

Previa: None

Placental Location: Anterior

Amniotic Fluid (Subjective): Normal

Amniotic Fluid (Objective):

AFI = 18.3 centimeters cm (5%ile= 9.0 cm, 95%= 23.4 cm for 30 wks)

FETAL BIOMETRY

BPD: 7.3cm 29w 3d

HC:   27.2cm 29w 5d

AC:   25.1cm 29w 2d

FL:   5.2cm 27w 4d

Current Mean GA: 28w 4d US EDC: 06/13/2018

Estimated Fetal Weight:  1278g 10%ile

FETAL ANATOMY

Lateral Ventricles: Appears normal

Thalami/CSP: Appears normal

Posterior Fossa:  Appears normal

Nuchal Region: Not applicable

Upper Lip: Not visualized

Spine: Appears normal

4 Chamber Heart on Left: Appears normal

LVOT: Appears normal

RVOT: Not visualized

Stomach on Left: Appears normal

3 Vessel Cord: Appears normal

Cord Insertion site: Not visualized

Kidneys: Appears normal

Bladder: Appears normal

Extremities: Appears normal

Technically difficult due to: Fetal position.

Maternal Findings:

Cervix:  3.6 centimeters on transabdominal evaluation
IMPRESSION: 1. Single living intrauterine fetus in transverse presentation.
2. Estimated fetal weight is in the 10th percentile for gestational
age.
3. No fetal anomalies identified.

## 2021-01-29 ENCOUNTER — Encounter: Payer: BC Managed Care – PPO | Admitting: Certified Nurse Midwife

## 2021-07-15 ENCOUNTER — Other Ambulatory Visit: Payer: Self-pay

## 2021-07-15 ENCOUNTER — Emergency Department (HOSPITAL_COMMUNITY)
Admission: EM | Admit: 2021-07-15 | Discharge: 2021-07-16 | Disposition: A | Discharge status: Home / Self Care | Payer: BC Managed Care – PPO | Attending: Emergency Medicine | Admitting: Emergency Medicine

## 2021-07-15 ENCOUNTER — Encounter (HOSPITAL_COMMUNITY): Payer: Self-pay | Admitting: *Deleted

## 2021-07-15 DIAGNOSIS — K625 Hemorrhage of anus and rectum: Secondary | ICD-10-CM

## 2021-07-15 DIAGNOSIS — N3001 Acute cystitis with hematuria: Secondary | ICD-10-CM | POA: Diagnosis not present

## 2021-07-15 DIAGNOSIS — R1031 Right lower quadrant pain: Secondary | ICD-10-CM | POA: Diagnosis present

## 2021-07-15 LAB — URINALYSIS, ROUTINE W REFLEX MICROSCOPIC
Bacteria, UA: NONE SEEN
Bilirubin Urine: NEGATIVE
Glucose, UA: NEGATIVE mg/dL
Ketones, ur: NEGATIVE mg/dL
Nitrite: NEGATIVE
Protein, ur: 30 mg/dL — AB
RBC / HPF: >50 RBC/hpf — ABNORMAL HIGH (ref 0–5)
Specific Gravity, Urine: 1.03 (ref 1.005–1.030)
pH: 6 (ref 5.0–8.0)

## 2021-07-15 LAB — PREGNANCY, URINE: Preg Test, Ur: NEGATIVE

## 2021-07-15 LAB — COMPREHENSIVE METABOLIC PANEL
ALT: 17 U/L (ref 0–44)
AST: 17 U/L (ref 15–41)
Albumin: 4 g/dL (ref 3.5–5.0)
Alkaline Phosphatase: 55 U/L (ref 38–126)
Anion gap: 7 (ref 5–15)
BUN: 12 mg/dL (ref 6–20)
CO2: 26 mmol/L (ref 22–32)
Calcium: 8.6 mg/dL — ABNORMAL LOW (ref 8.9–10.3)
Chloride: 103 mmol/L (ref 98–111)
Creatinine, Ser: 0.68 mg/dL (ref 0.44–1.00)
GFR, Estimated: >60 mL/min (ref >60)
Glucose, Bld: 116 mg/dL — ABNORMAL HIGH (ref 70–99)
Potassium: 3.6 mmol/L (ref 3.5–5.1)
Sodium: 136 mmol/L (ref 135–145)
Total Bilirubin: 0.5 mg/dL (ref 0.3–1.2)
Total Protein: 7.8 g/dL (ref 6.5–8.1)

## 2021-07-15 LAB — CBC
HCT: 36.6 % (ref 36.0–46.0)
Hemoglobin: 12.1 g/dL (ref 12.0–15.0)
MCH: 29.4 pg (ref 26.0–34.0)
MCHC: 33.1 g/dL (ref 30.0–36.0)
MCV: 88.8 fL (ref 80.0–100.0)
Platelets: 507 10*3/uL — ABNORMAL HIGH (ref 150–400)
RBC: 4.12 MIL/uL (ref 3.87–5.11)
RDW: 13 % (ref 11.5–15.5)
WBC: 15.4 10*3/uL — ABNORMAL HIGH (ref 4.0–10.5)
nRBC: 0 % (ref 0.0–0.2)

## 2021-07-15 LAB — LIPASE, BLOOD: Lipase: 26 U/L (ref 11–51)

## 2021-07-15 MED ORDER — ONDANSETRON HCL 4 MG/2ML IJ SOLN
4.0000 mg | Freq: Once | INTRAMUSCULAR | Status: AC
  Administered 2021-07-15: 4 mg via INTRAVENOUS
  Filled 2021-07-15: qty 2

## 2021-07-15 MED ORDER — SODIUM CHLORIDE 0.9 % IV BOLUS
500.0000 mL | Freq: Once | INTRAVENOUS | Status: AC
  Administered 2021-07-15: 500 mL via INTRAVENOUS

## 2021-07-15 MED ORDER — MORPHINE SULFATE (PF) 4 MG/ML IV SOLN
4.0000 mg | Freq: Once | INTRAVENOUS | Status: AC
  Administered 2021-07-15: 4 mg via INTRAVENOUS
  Filled 2021-07-15: qty 1

## 2021-07-15 NOTE — ED Provider Notes (Signed)
[facility] EMERGENCY DEPARTMENT Provider Note   CSN: [id number] Arrival date & time: 07/15/21  2125     History {Add pertinent medical, surgical, social history, OB history to HPI:1} Chief Complaint  Patient presents with   Abdominal Pain    Sarah Blankenship is a 26 y.o. female.  Patient presents to the emergency department for evaluation of abdominal pain.  Patient reports that pain began somewhat suddenly earlier tonight.  Patient reports pain in the right lower and diffuse lower abdomen with pain into the rectum.  She felt like she needed to have a bowel movement but could not.  She passed a small amount of stool but it was mostly bloody mucus.       Home Medications Prior to Admission medications   Medication Sig Start Date End Date Taking? Authorizing Provider  cetirizine (ZYRTEC) 10 MG tablet Take 10 mg by mouth daily.    [provider]  guaiFENesin-codeine (ROBITUSSIN AC) 100-10 MG/5ML syrup Take 5 mLs by mouth 3 (three) times daily as needed for cough. 05/16/20   Moshe Cipro, NP  Vitamin D, Ergocalciferol, (DRISDOL) 1.25 MG (50000 UNIT) CAPS capsule Take 1 capsule (50,000 Units total) by mouth every 7 (seven) days. 01/28/20   Gunnar Bulla, CNM      Allergies    Cefzil [cefprozil]    Review of Systems   Review of Systems  Physical Exam Updated Vital Signs BP 133/89   Pulse 83   Temp 98.3 F (36.8 C) (Oral)   Resp 16   Ht 5' (1.524 m)   Wt 77.1 kg   LMP  (LMP Unknown)   SpO2 98%   BMI 33.20 kg/m  Physical Exam Vitals and nursing note reviewed.  Constitutional:      General: She is not in acute distress.    Appearance: She is well-developed.  HENT:     Head: Normocephalic and atraumatic.     Mouth/Throat:     Mouth: Mucous membranes are moist.  Eyes:     General: Vision grossly intact. Gaze aligned appropriately.     Extraocular Movements: Extraocular movements intact.     Conjunctiva/sclera: Conjunctivae normal.   Cardiovascular:     Rate and Rhythm: Normal rate and regular rhythm.     Pulses: Normal pulses.     Heart sounds: Normal heart sounds, S1 normal and S2 normal. No murmur heard.    No friction rub. No gallop.  Pulmonary:     Effort: Pulmonary effort is normal. No respiratory distress.     Breath sounds: Normal breath sounds.  Abdominal:     General: Bowel sounds are normal.     Palpations: Abdomen is soft.     Tenderness: There is abdominal tenderness in the right lower quadrant, suprapubic area and left lower quadrant. There is no guarding or rebound.     Hernia: No hernia is present.  Musculoskeletal:        General: No swelling.     Cervical back: Full passive range of motion without pain, normal range of motion and neck supple. No spinous process tenderness or muscular tenderness. Normal range of motion.     Right lower leg: No edema.     Left lower leg: No edema.  Skin:    General: Skin is warm and dry.     Capillary Refill: Capillary refill takes less than 2 seconds.     Findings: No ecchymosis, erythema, rash or wound.  Neurological:     General: No focal  deficit present.     Mental Status: She is alert and oriented to person, place, and time.     GCS: GCS eye subscore is 4. GCS verbal subscore is 5. GCS motor subscore is 6.     Cranial Nerves: Cranial nerves 2-12 are intact.     Sensory: Sensation is intact.     Motor: Motor function is intact.     Coordination: Coordination is intact.  Psychiatric:        Attention and Perception: Attention normal.        Mood and Affect: Mood normal.        Speech: Speech normal.        Behavior: Behavior normal.     ED Results / Procedures / Treatments   Labs (all labs ordered are listed, but only abnormal results are displayed) Labs Reviewed  COMPREHENSIVE METABOLIC PANEL - Abnormal; Notable for the following components:      Result Value   Glucose, Bld 116 (*)    Calcium 8.6 (*)    All other components within normal limits   CBC - Abnormal; Notable for the following components:   WBC 15.4 (*)    Platelets 507 (*)    All other components within normal limits  LIPASE, BLOOD  URINALYSIS, ROUTINE W REFLEX MICROSCOPIC  PREGNANCY, URINE    EKG None  Radiology No results found.  Procedures Procedures  {Document cardiac monitor, telemetry assessment procedure when appropriate:1}  Medications Ordered in ED Medications  sodium chloride 0.9 % bolus 500 mL (has no administration in time range)  ondansetron (ZOFRAN) injection 4 mg (has no administration in time range)  morphine (PF) 4 MG/ML injection 4 mg (has no administration in time range)    ED Course/ Medical Decision Making/ A&P                           Medical Decision Making Amount and/or Complexity of Data Reviewed Labs: ordered.  Risk Prescription drug management.   ***  {Document critical care time when appropriate:1} {Document review of labs and clinical decision tools ie heart score, Chads2Vasc2 etc:1}  {Document your independent review of radiology images, and any outside records:1} {Document your discussion with family members, caretakers, and with consultants:1} {Document social determinants of health affecting pt's care:1} {Document your decision making why or why not admission, treatments were needed:1} Final Clinical Impression(s) / ED Diagnoses Final diagnoses:  None    Rx / DC Orders ED Discharge Orders     None

## 2021-07-15 NOTE — ED Triage Notes (Signed)
Pt with abd pain started suddenly about an hour ago, started with rectal pain with some blood and mucus per pt. Emesis x 1.

## 2021-07-15 NOTE — ED Notes (Signed)
ED Provider at bedside. 

## 2021-07-15 NOTE — ED Notes (Signed)
Pt ambulated to restroom. 

## 2021-07-16 ENCOUNTER — Emergency Department (HOSPITAL_COMMUNITY): Payer: BC Managed Care – PPO

## 2021-07-16 MED ORDER — IOHEXOL 300 MG/ML  SOLN
100.0000 mL | Freq: Once | INTRAMUSCULAR | Status: AC | PRN
  Administered 2021-07-16: 100 mL via INTRAVENOUS

## 2021-07-16 MED ORDER — METRONIDAZOLE 500 MG PO TABS: 500.0000 mg | ORAL_TABLET | Freq: Three times a day (TID) | ORAL | 0 refills | Status: DC

## 2021-07-16 MED ORDER — SULFAMETHOXAZOLE-TRIMETHOPRIM 800-160 MG PO TABS: 1.0000 | ORAL_TABLET | Freq: Two times a day (BID) | ORAL | 0 refills | Status: AC

## 2021-10-22 ENCOUNTER — Ambulatory Visit
Admission: RE | Admit: 2021-10-22 | Discharge: 2021-10-22 | Disposition: A | Discharge status: Home / Self Care | Payer: BC Managed Care – PPO | Source: Ambulatory Visit | Attending: Emergency Medicine | Admitting: Emergency Medicine

## 2021-10-22 ENCOUNTER — Encounter: Payer: Self-pay | Admitting: Certified Nurse Midwife

## 2021-10-22 VITALS — BP 142/87 | HR 94 | Temp 98.4°F | Resp 18

## 2021-10-22 DIAGNOSIS — L259 Unspecified contact dermatitis, unspecified cause: Secondary | ICD-10-CM

## 2021-10-22 DIAGNOSIS — E669 Obesity, unspecified: Secondary | ICD-10-CM | POA: Insufficient documentation

## 2021-10-22 MED ORDER — PREDNISONE 10 MG (21) PO TBPK: ORAL_TABLET | Freq: Every day | ORAL | 0 refills | Status: DC

## 2021-10-22 NOTE — Discharge Instructions (Addendum)
Take prednisone as directed.    Take Benadryl or Zyrtec as directed.    Follow up with your primary care provider.

## 2021-10-22 NOTE — ED Triage Notes (Signed)
Patient presents to [facility] for rash on right side of neck since Saturday. Reports pain and irritation. Concerned with shingles. Treating with neosporin last night.   Denies fever.

## 2021-10-22 NOTE — ED Provider Notes (Signed)
[facility]    CSN: [id number] Arrival date & time: 10/22/21  1049      History   Chief Complaint Chief Complaint  Patient presents with   Rash    HPI Sarah Blankenship is a 26 y.o. female.  Patient presents with pruritic rash on her neck, chest, upper back x2 days.  The rash is spreading.  Treatment at home with Neosporin.  Patient also takes Zyrtec daily for allergies.  She denies fever, chills, sore throat, cough, shortness of breath, or other symptoms.  No new products, foods, medications.  No recent travel.  No one at home with similar symptoms.  The history is provided by the patient and medical records.    Past Medical History:  Diagnosis Date   Anemia    Family history of breast cancer    Family history of colon cancer    Seasonal allergies    Tachycardia     Patient Active Problem List   Diagnosis Date Noted   Obesity 10/22/2021   Sunburn 07/13/2020   Genetic testing 05/23/2020   Family history of breast cancer    Family history of colon cancer    Vitamin D deficiency 07/22/2018   Gestational hypertension 05/22/2018    Past Surgical History:  Procedure Laterality Date   MULTIPLE TOOTH EXTRACTIONS     NASAL FRACTURE SURGERY      OB History     Gravida  1   Para  1   Term  1   Preterm  0   AB  0   Living  1      SAB  0   IAB  0   Ectopic  0   Multiple  0   Live Births  1            Home Medications    Prior to Admission medications   Medication Sig Start Date End Date Taking? Authorizing Provider  predniSONE (STERAPRED UNI-PAK 21 TAB) 10 MG (21) TBPK tablet Take by mouth daily. As directed 10/22/21  Yes Mickie Bail, NP  cetirizine (ZYRTEC) 10 MG tablet Take 10 mg by mouth daily.    [provider]  guaiFENesin-codeine (ROBITUSSIN AC) 100-10 MG/5ML syrup Take 5 mLs by mouth 3 (three) times daily as needed for cough. 05/16/20   Moshe Cipro, NP  metroNIDAZOLE (FLAGYL) 500 MG tablet Take 1 tablet  (500 mg total) by mouth 3 (three) times daily. 07/16/21   Gilda Crease, MD  Vitamin D, Ergocalciferol, (DRISDOL) 1.25 MG (50000 UNIT) CAPS capsule Take 1 capsule (50,000 Units total) by mouth every 7 (seven) days. 01/28/20   Gunnar Bulla, CNM    Family History Family History  Problem Relation Age of Onset   Thyroid disease Paternal Grandmother    Diabetes Maternal Grandmother    Breast cancer Maternal Grandmother        dx 50s-60s   Diabetes Maternal Grandfather    Diabetes Mother    Hypertension Mother    High Cholesterol Mother    Colon cancer Paternal Uncle        dx 18s   Breast cancer Other    Colon cancer Other    Colon cancer Maternal Great-grandfather    Bone cancer Other    Ovarian cancer Neg Hx     Social History Social History   Tobacco Use   Smoking status: Never   Smokeless tobacco: Never  Vaping Use   Vaping Use: Never used  Substance Use  Topics   Alcohol use: No   Drug use: No     Allergies   Cefzil [cefprozil]   Review of Systems Review of Systems  Constitutional:  Negative for chills and fever.  HENT:  Negative for ear pain and sore throat.   Respiratory:  Negative for cough and shortness of breath.   Cardiovascular:  Negative for chest pain and palpitations.  Gastrointestinal:  Negative for diarrhea and vomiting.  Skin:  Positive for rash.  All other systems reviewed and are negative.    Physical Exam Triage Vital Signs ED Triage Vitals  Enc Vitals Group     BP 10/22/21 1059 (!) 142/87     Pulse Rate 10/22/21 1059 94     Resp 10/22/21 1059 18     Temp 10/22/21 1059 98.4 F (36.9 C)     Temp src --      SpO2 10/22/21 1059 96 %     Weight --      Height --      Head Circumference --      Peak Flow --      Pain Score 10/22/21 1109 5     Pain Loc --      Pain Edu? --      Excl. in Reader? --    No data found.  Updated Vital Signs BP (!) 142/87   Pulse 94   Temp 98.4 F (36.9 C)   Resp 18   LMP 10/15/2021  (Approximate)   SpO2 96%   Visual Acuity Right Eye Distance:   Left Eye Distance:   Bilateral Distance:    Right Eye Near:   Left Eye Near:    Bilateral Near:     Physical Exam Vitals and nursing note reviewed.  Constitutional:      General: She is not in acute distress.    Appearance: Normal appearance. She is well-developed. She is not ill-appearing.  HENT:     Mouth/Throat:     Mouth: Mucous membranes are moist.     Pharynx: Oropharynx is clear.  Cardiovascular:     Rate and Rhythm: Normal rate and regular rhythm.     Heart sounds: Normal heart sounds.  Pulmonary:     Effort: Pulmonary effort is normal. No respiratory distress.     Breath sounds: Normal breath sounds.  Musculoskeletal:     Cervical back: Neck supple.  Skin:    General: Skin is warm and dry.     Findings: Rash present.     Comments: Papular and vesicular rash on neck, chest, upper back.  See pictures for details.  Neurological:     Mental Status: She is alert.  Psychiatric:        Mood and Affect: Mood normal.        Behavior: Behavior normal.         UC Treatments / Results  Labs (all labs ordered are listed, but only abnormal results are displayed) Labs Reviewed - No data to display  EKG   Radiology No results found.  Procedures Procedures (including critical care time)  Medications Ordered in UC Medications - No data to display  Initial Impression / Assessment and Plan / UC Course  I have reviewed the triage vital signs and the nursing notes.  Pertinent labs & imaging results that were available during my care of the patient were reviewed by me and considered in my medical decision making (see chart for details).    Contact dermatitis.  The rash is  across multiple dermatomal lines.  Treating with prednisone and Zyrtec or Benadryl.  Education provided on contact dermatitis.  Instructed patient to follow up with her PCP if her symptoms are not improving.  She agrees to plan of  care.    Final Clinical Impressions(s) / UC Diagnoses   Final diagnoses:  Contact dermatitis, unspecified contact dermatitis type, unspecified trigger     Discharge Instructions      Take prednisone as directed.    Take Benadryl or Zyrtec as directed.    Follow up with your primary care provider.       ED Prescriptions     Medication Sig Dispense Auth. Provider   predniSONE (STERAPRED UNI-PAK 21 TAB) 10 MG (21) TBPK tablet Take by mouth daily. As directed 21 tablet Mickie Bail, NP      PDMP not reviewed this encounter.   Mickie Bail, NP 10/22/21 1151

## 2023-10-09 DIAGNOSIS — Z8759 Personal history of other complications of pregnancy, childbirth and the puerperium: Secondary | ICD-10-CM | POA: Insufficient documentation

## 2023-10-18 ENCOUNTER — Other Ambulatory Visit: Payer: Self-pay

## 2023-10-18 ENCOUNTER — Encounter (HOSPITAL_COMMUNITY): Payer: Self-pay | Admitting: Obstetrics

## 2023-10-18 ENCOUNTER — Inpatient Hospital Stay (HOSPITAL_COMMUNITY)
Admission: AD | Admit: 2023-10-18 | Discharge: 2023-10-19 | Disposition: A | Discharge status: Home / Self Care | Attending: Obstetrics & Gynecology | Admitting: Obstetrics & Gynecology

## 2023-10-18 DIAGNOSIS — Z3A08 8 weeks gestation of pregnancy: Secondary | ICD-10-CM | POA: Diagnosis not present

## 2023-10-18 DIAGNOSIS — O219 Vomiting of pregnancy, unspecified: Secondary | ICD-10-CM | POA: Insufficient documentation

## 2023-10-18 LAB — CBC
HCT: 38.9 % (ref 36.0–46.0)
Hemoglobin: 13.4 g/dL (ref 12.0–15.0)
MCH: 30.5 pg (ref 26.0–34.0)
MCHC: 34.4 g/dL (ref 30.0–36.0)
MCV: 88.4 fL (ref 80.0–100.0)
Platelets: 480 K/uL — ABNORMAL HIGH (ref 150–400)
RBC: 4.4 MIL/uL (ref 3.87–5.11)
RDW: 12.3 % (ref 11.5–15.5)
WBC: 17.4 K/uL — ABNORMAL HIGH (ref 4.0–10.5)
nRBC: 0 % (ref 0.0–0.2)

## 2023-10-18 LAB — COMPREHENSIVE METABOLIC PANEL WITH GFR
ALT: 15 U/L (ref 0–44)
AST: 20 U/L (ref 15–41)
Albumin: 3.5 g/dL (ref 3.5–5.0)
Alkaline Phosphatase: 43 U/L (ref 38–126)
Anion gap: 15 (ref 5–15)
BUN: 8 mg/dL (ref 6–20)
CO2: 20 mmol/L — ABNORMAL LOW (ref 22–32)
Calcium: 9.1 mg/dL (ref 8.9–10.3)
Chloride: 103 mmol/L (ref 98–111)
Creatinine, Ser: 0.59 mg/dL (ref 0.44–1.00)
GFR, Estimated: >60 mL/min (ref >60)
Glucose, Bld: 103 mg/dL — ABNORMAL HIGH (ref 70–99)
Potassium: 3.6 mmol/L (ref 3.5–5.1)
Sodium: 138 mmol/L (ref 135–145)
Total Bilirubin: 0.6 mg/dL (ref 0.0–1.2)
Total Protein: 7.2 g/dL (ref 6.5–8.1)

## 2023-10-18 LAB — URINALYSIS, ROUTINE W REFLEX MICROSCOPIC
Bacteria, UA: NONE SEEN
Bilirubin Urine: NEGATIVE
Glucose, UA: NEGATIVE mg/dL
Hgb urine dipstick: NEGATIVE
Ketones, ur: 80 mg/dL — AB
Leukocytes,Ua: NEGATIVE
Nitrite: NEGATIVE
Protein, ur: 30 mg/dL — AB
Specific Gravity, Urine: 1.032 — ABNORMAL HIGH (ref 1.005–1.030)
pH: 5 (ref 5.0–8.0)

## 2023-10-18 LAB — POCT PREGNANCY, URINE: Preg Test, Ur: POSITIVE — AB

## 2023-10-18 LAB — TSH: TSH: 3.207 u[IU]/mL (ref 0.350–4.500)

## 2023-10-18 LAB — T4, FREE: Free T4: 0.82 ng/dL (ref 0.61–1.12)

## 2023-10-18 MED ORDER — METOCLOPRAMIDE HCL 5 MG/ML IJ SOLN
10.0000 mg | Freq: Once | INTRAMUSCULAR | Status: AC
  Administered 2023-10-18: 10 mg via INTRAVENOUS
  Filled 2023-10-18: qty 2

## 2023-10-18 MED ORDER — FAMOTIDINE IN NACL 20-0.9 MG/50ML-% IV SOLN
20.0000 mg | Freq: Once | INTRAVENOUS | Status: AC
  Administered 2023-10-18: 20 mg via INTRAVENOUS
  Filled 2023-10-18: qty 50

## 2023-10-18 MED ORDER — LACTATED RINGERS IV BOLUS
1000.0000 mL | Freq: Once | INTRAVENOUS | Status: AC
  Administered 2023-10-18: 1000 mL via INTRAVENOUS

## 2023-10-18 NOTE — MAU Provider Note (Signed)
 Chief Complaint:  Nausea and Emesis   HPI   None     Sarah Blankenship is a 28 y.o. G2P1001 at [redacted]w[redacted]d who presents to maternity admissions reporting 2 weeks of nausea and vomiting that got worse today. Unable to hold down water, pretzels, crackers, popsicle or sausage biscuit.  Took Phenergan around noon, vomited about 20 minutes later. Does have zofran  but didn't try it. Was able to sleep for about 3 hours afterwards. Was able to eat a peppermint candy in the waiting room. Describes abdominal soreness due to vomiting but no t pain. Denies blood or mucus in the vomit. Denies headaches, shortness of breath, chest pain, or abdominal pain.   Pregnancy Course: She receives prenatal care at [facility].  Past Medical History:  Diagnosis Date   Anemia    Family history of breast cancer    Family history of colon cancer    Seasonal allergies    Tachycardia    OB History  Gravida Para Term Preterm AB Living  2 1 1  0 0 1  SAB IAB Ectopic Multiple Live Births  0 0 0 0 1    # Outcome Date GA Lbr Len/2nd Weight Sex Type Anes PTL Lv  2 Current           1 Term 05/28/18 [redacted]w[redacted]d  2013 g F Vag-Spont EPI N LIV     Complications: Low amniotic fluid   Past Surgical History:  Procedure Laterality Date   MULTIPLE TOOTH EXTRACTIONS     NASAL FRACTURE SURGERY     Family History  Problem Relation Age of Onset   Thyroid  disease Paternal Grandmother    Diabetes Maternal Grandmother    Breast cancer Maternal Grandmother        dx 50s-60s   Diabetes Maternal Grandfather    Diabetes Mother    Hypertension Mother    High Cholesterol Mother    Colon cancer Paternal Uncle        dx 49s   Breast cancer Other    Colon cancer Other    Colon cancer Maternal Great-grandfather    Bone cancer Other    Ovarian cancer Neg Hx    Social History   Tobacco Use   Smoking status: Never   Smokeless tobacco: Never  Vaping Use   Vaping status: Never Used  Substance Use Topics   Alcohol use: No   Drug use: No    Allergies  Allergen Reactions   Cefzil [Cefprozil] Rash   Medications Prior to Admission  Medication Sig Dispense Refill Last Dose/Taking   cetirizine (ZYRTEC) 10 MG tablet Take 10 mg by mouth daily.   10/17/2023   ondansetron  (ZOFRAN ) 4 MG tablet Take 4 mg by mouth every 8 (eight) hours as needed for nausea or vomiting.   10/17/2023   promethazine (PHENERGAN) 25 MG tablet Take 25 mg by mouth every 6 (six) hours as needed for nausea or vomiting.   10/18/2023   guaiFENesin -codeine  (ROBITUSSIN AC) 100-10 MG/5ML syrup Take 5 mLs by mouth 3 (three) times daily as needed for cough. (Patient not taking: Reported on 10/18/2023) 120 mL 0 Not Taking   metroNIDAZOLE  (FLAGYL ) 500 MG tablet Take 1 tablet (500 mg total) by mouth 3 (three) times daily. (Patient not taking: Reported on 10/18/2023) 21 tablet 0 Not Taking   predniSONE  (STERAPRED UNI-PAK 21 TAB) 10 MG (21) TBPK tablet Take by mouth daily. As directed (Patient not taking: Reported on 10/18/2023) 21 tablet 0 Not Taking   Vitamin D , Ergocalciferol , (  DRISDOL ) 1.25 MG (50000 UNIT) CAPS capsule Take 1 capsule (50,000 Units total) by mouth every 7 (seven) days. (Patient not taking: Reported on 10/18/2023) 14 capsule 1 Not Taking    I have reviewed patient's Past Medical Hx, Surgical Hx, Family Hx, Social Hx, medications and allergies.   ROS  Pertinent items noted in HPI and remainder of comprehensive ROS otherwise negative.   PHYSICAL EXAM  Patient Vitals for the past 24 hrs:  BP Temp Temp src Pulse Resp SpO2 Height Weight  10/18/23 2139 122/69 98.4 F (36.9 C) Oral 98 18 100 % 5' (1.524 m) 69.4 kg    Constitutional: Well-developed, well-nourished female in no acute distress.  Cardiovascular: Warm and well-perfused Respiratory: normal effort, no problems with respiration noted GI: Abd soft, non-tender, non-distended MS: Extremities nontender, no edema, normal ROM Neurologic: Alert and oriented x 4.  Pelvic: deferred         Labs: Results for orders placed or performed during the hospital encounter of 10/18/23 (from the past 24 hours)  Pregnancy, urine POC     Status: Abnormal   Collection Time: 10/18/23  9:48 PM  Result Value Ref Range   Preg Test, Ur POSITIVE (A) NEGATIVE  Urinalysis, Routine w reflex microscopic -Urine, Clean Catch     Status: Abnormal   Collection Time: 10/18/23  9:48 PM  Result Value Ref Range   Color, Urine AMBER (A) YELLOW   APPearance HAZY (A) CLEAR   Specific Gravity, Urine 1.032 (H) 1.005 - 1.030   pH 5.0 5.0 - 8.0   Glucose, UA NEGATIVE NEGATIVE mg/dL   Hgb urine dipstick NEGATIVE NEGATIVE   Bilirubin Urine NEGATIVE NEGATIVE   Ketones, ur 80 (A) NEGATIVE mg/dL   Protein, ur 30 (A) NEGATIVE mg/dL   Nitrite NEGATIVE NEGATIVE   Leukocytes,Ua NEGATIVE NEGATIVE   RBC / HPF 0-5 0 - 5 RBC/hpf   WBC, UA 0-5 0 - 5 WBC/hpf   Bacteria, UA NONE SEEN NONE SEEN   Squamous Epithelial / HPF 6-10 0 - 5 /HPF   Mucus PRESENT   CBC     Status: Abnormal   Collection Time: 10/18/23 10:36 PM  Result Value Ref Range   WBC 17.4 (H) 4.0 - 10.5 K/uL   RBC 4.40 3.87 - 5.11 MIL/uL   Hemoglobin 13.4 12.0 - 15.0 g/dL   HCT 61.0 63.9 - 53.9 %   MCV 88.4 80.0 - 100.0 fL   MCH 30.5 26.0 - 34.0 pg   MCHC 34.4 30.0 - 36.0 g/dL   RDW 87.6 88.4 - 84.4 %   Platelets 480 (H) 150 - 400 K/uL   nRBC 0.0 0.0 - 0.2 %  Comprehensive metabolic panel with GFR     Status: Abnormal   Collection Time: 10/18/23 10:36 PM  Result Value Ref Range   Sodium 138 135 - 145 mmol/L   Potassium 3.6 3.5 - 5.1 mmol/L   Chloride 103 98 - 111 mmol/L   CO2 20 (L) 22 - 32 mmol/L   Glucose, Bld 103 (H) 70 - 99 mg/dL   BUN 8 6 - 20 mg/dL   Creatinine, Ser 9.40 0.44 - 1.00 mg/dL   Calcium 9.1 8.9 - 89.6 mg/dL   Total Protein 7.2 6.5 - 8.1 g/dL   Albumin 3.5 3.5 - 5.0 g/dL   AST 20 15 - 41 U/L   ALT 15 0 - 44 U/L   Alkaline Phosphatase 43 38 - 126 U/L   Total Bilirubin 0.6 0.0 - 1.2 mg/dL  GFR, Estimated >60 >60 mL/min    Anion gap 15 5 - 15  TSH     Status: None   Collection Time: 10/18/23 10:36 PM  Result Value Ref Range   TSH 3.207 0.350 - 4.500 uIU/mL  T4, free     Status: None   Collection Time: 10/18/23 10:36 PM  Result Value Ref Range   Free T4 0.82 0.61 - 1.12 ng/dL    Imaging:  No results found.  MDM & MAU COURSE  MDM: Moderate  MAU Course: Orders Placed This Encounter  Procedures   CBC   Urinalysis, Routine w reflex microscopic -Urine, Clean Catch   Comprehensive metabolic panel with GFR   TSH   T4, free   Pregnancy, urine POC   Discharge patient   Meds ordered this encounter  Medications   lactated ringers  bolus 1,000 mL   famotidine (PEPCID) IVPB 20 mg premix   metoCLOPramide (REGLAN) injection 10 mg   scopolamine (TRANSDERM-SCOP) 1 MG/3DAYS 1 mg   scopolamine (TRANSDERM-SCOP) 1 MG/3DAYS    Sig: Place 1 patch (1 mg total) onto the skin every 3 (three) days.    Dispense:  10 patch    Refill:  12   VSS, signs of dehydration seen in labs. Given LR bolus, pepcid and reglan, vomited after receiving reglan. Re-evaluated while bolus was running and stated that she was feeling better. Given scopolamine patch and had a successful PO trial. Comfortable with going home, has an upcoming appointment with OB on 10/13. All questions answered prior to discharge.   ASSESSMENT   1. [redacted] weeks gestation of pregnancy   2. Nausea and vomiting during pregnancy prior to [redacted] weeks gestation     PLAN  Discharge home in stable condition with return precautions.  Follow up with OB on 10/13 for scheduled appointment.    Allergies as of 10/19/2023       Reactions   Cefzil [cefprozil] Rash        Medication List     STOP taking these medications    guaiFENesin -codeine  100-10 MG/5ML syrup Commonly known as: ROBITUSSIN AC   metroNIDAZOLE  500 MG tablet Commonly known as: FLAGYL    predniSONE  10 MG (21) Tbpk tablet Commonly known as: STERAPRED UNI-PAK 21 TAB   Vitamin D  (Ergocalciferol )  1.25 MG (50000 UNIT) Caps capsule Commonly known as: DRISDOL        TAKE these medications    cetirizine 10 MG tablet Commonly known as: ZYRTEC Take 10 mg by mouth daily.   ondansetron  4 MG tablet Commonly known as: ZOFRAN  Take 4 mg by mouth every 8 (eight) hours as needed for nausea or vomiting.   promethazine 25 MG tablet Commonly known as: PHENERGAN Take 25 mg by mouth every 6 (six) hours as needed for nausea or vomiting.   scopolamine 1 MG/3DAYS Commonly known as: TRANSDERM-SCOP Place 1 patch (1 mg total) onto the skin every 3 (three) days. Start taking on: October 22, 2023        Charlie Courts, MD  Family Medicine - Obstetrics Fellow

## 2023-10-18 NOTE — MAU Note (Signed)
..  Sarah Blankenship is a 28 y.o. at Unknown here in MAU reporting: nausea vomiting for 2 weeks, today it got worse to where she has not been able to keep anything down.  Reports she took phenergan at noon but vomited around 20 minutes after. Denies pain or vaginal bleeding.   LMP: 08/24/2023  Pain score: 0/10 Vitals:   10/18/23 2139  BP: 122/69  Pulse: 98  Resp: 18  Temp: 98.4 F (36.9 C)  SpO2: 100%     FHT:n/a Lab orders placed from triage:  POCT pregnancy test

## 2023-10-19 DIAGNOSIS — O219 Vomiting of pregnancy, unspecified: Secondary | ICD-10-CM

## 2023-10-19 DIAGNOSIS — Z3A08 8 weeks gestation of pregnancy: Secondary | ICD-10-CM

## 2023-10-19 MED ORDER — SCOPOLAMINE 1 MG/3DAYS TD PT72: 1.0000 | MEDICATED_PATCH | TRANSDERMAL | 12 refills | Status: AC

## 2023-10-19 MED ORDER — SCOPOLAMINE 1 MG/3DAYS TD PT72
1.0000 | MEDICATED_PATCH | TRANSDERMAL | Status: DC
  Administered 2023-10-19: 1 mg via TRANSDERMAL
  Filled 2023-10-19: qty 1

## 2023-12-09 ENCOUNTER — Inpatient Hospital Stay (HOSPITAL_COMMUNITY)
Admission: AD | Admit: 2023-12-09 | Discharge: 2023-12-09 | Disposition: A | Discharge status: Home / Self Care | Attending: Obstetrics & Gynecology | Admitting: Obstetrics & Gynecology

## 2023-12-09 ENCOUNTER — Other Ambulatory Visit: Payer: Self-pay

## 2023-12-09 DIAGNOSIS — O219 Vomiting of pregnancy, unspecified: Secondary | ICD-10-CM | POA: Insufficient documentation

## 2023-12-09 DIAGNOSIS — O4102X0 Oligohydramnios, second trimester, not applicable or unspecified: Secondary | ICD-10-CM | POA: Insufficient documentation

## 2023-12-09 DIAGNOSIS — Z3A16 16 weeks gestation of pregnancy: Secondary | ICD-10-CM | POA: Insufficient documentation

## 2023-12-09 LAB — URINALYSIS, ROUTINE W REFLEX MICROSCOPIC
Bilirubin Urine: NEGATIVE
Glucose, UA: NEGATIVE mg/dL
Hgb urine dipstick: NEGATIVE
Ketones, ur: 20 mg/dL — AB
Nitrite: NEGATIVE
Protein, ur: 30 mg/dL — AB
Specific Gravity, Urine: 1.027 (ref 1.005–1.030)
pH: 5 (ref 5.0–8.0)

## 2023-12-09 LAB — BASIC METABOLIC PANEL WITH GFR
Anion gap: 12 (ref 5–15)
BUN: 8 mg/dL (ref 6–20)
CO2: 23 mmol/L (ref 22–32)
Calcium: 9.1 mg/dL (ref 8.9–10.3)
Chloride: 101 mmol/L (ref 98–111)
Creatinine, Ser: 0.66 mg/dL (ref 0.44–1.00)
GFR, Estimated: >60 mL/min (ref >60)
Glucose, Bld: 101 mg/dL — ABNORMAL HIGH (ref 70–99)
Potassium: 3.9 mmol/L (ref 3.5–5.1)
Sodium: 136 mmol/L (ref 135–145)

## 2023-12-09 MED ORDER — ONDANSETRON HCL 4 MG PO TABS: 4.0000 mg | ORAL_TABLET | Freq: Three times a day (TID) | ORAL | 0 refills | Status: AC | PRN

## 2023-12-09 MED ORDER — FAMOTIDINE 20 MG PO TABS: 20.0000 mg | ORAL_TABLET | Freq: Every day | ORAL | 0 refills | Status: AC

## 2023-12-09 MED ORDER — PROMETHAZINE HCL 25 MG PO TABS: 25.0000 mg | ORAL_TABLET | Freq: Four times a day (QID) | ORAL | 0 refills | Status: AC | PRN

## 2023-12-09 MED ORDER — METOCLOPRAMIDE HCL 10 MG PO TABS: 10.0000 mg | ORAL_TABLET | Freq: Four times a day (QID) | ORAL | 0 refills | Status: AC

## 2023-12-09 MED ORDER — ONDANSETRON 4 MG PO TBDP
8.0000 mg | ORAL_TABLET | Freq: Once | ORAL | Status: AC
  Administered 2023-12-09: 8 mg via ORAL
  Filled 2023-12-09: qty 2

## 2023-12-09 MED ORDER — SCOPOLAMINE 1 MG/3DAYS TD PT72: 1.0000 | MEDICATED_PATCH | TRANSDERMAL | Status: DC

## 2023-12-09 NOTE — Discharge Instructions (Addendum)
 I am so sorry you are not feeling well. There are no signs of severe dehydration on your labs today. I have sent some additional types of nausea medicine to the pharmacy so you have several options!  I hope you feel better soon. If your nausea is still not controlled even with all of the options listed below, talk with your OB about other outpatient management options like IV infusions or steroid courses.  NAUSEA: NAUSEA PREVENTION: Some people need to be on a scheduled medicine to prevent nausea. Diclegis (or Unison plus vitamin B6) works well for some people, but other people may need a stronger medicine. If Diclegis is not working, we usually recommend stopping that and switching to promethazine (Phenergan) every 6 hours. This can be taken orally or placed intravaginally. Adding a scopolamine  patch, changed every 3 days, is also good for baseline nausea prevention. Daily famotidine  (Pepcid ) reduces how much stomach acid you have, which can be helpful with both heartburn and nausea. We recommend eating within an hour of getting up, some people even eat a few crackers before getting out of bed. Then make sure you eat at least something small every 2-3 hours, focusing on protein and bland foods. Going too long between meals can make the nausea worse. Also, try not to drink a lot of fluids on an empty stomach if you feel nauseas, this may just make it worse! Peppermint essential oil: you can keep this with you to smell or put a little behind your ears when you're nauseas Ginger candy: I specifically like the [employer] Belly Comfort. You can search online for morning sickness drops/candy and find other things! You can also try Ginger Ale or ginger teas. Frozen fruits or popsicles BREAKTHROUGH NAUSEA TREATMENT I have sent two options for you to use for breakthrough nausea as needed: dissolvable ondansetron  (Zofran ), or metoclopramide  (Reglan ). The Reglan  can be taken orally, or it can be placed  intravaginally if needed.

## 2023-12-09 NOTE — MAU Note (Signed)
 Sarah Blankenship is a 28 y.o. at [redacted]w[redacted]d here in MAU reporting: she's had N/V since Friday, but able to keep down fluids since yesterday Denies VB and LOF  LMP: 08/19/2023 Onset of complaint: Friday Pain score: 4 Vitals:   12/09/23 0859  BP: 117/68  Pulse: 81  Resp: 18  Temp: 98.6 F (37 C)  SpO2: 99%     FHT: 152 bpm  Lab orders placed from triage: UA

## 2023-12-09 NOTE — MAU Provider Note (Signed)
 Chief Complaint:  Emesis and Nausea   HPI   None     Sarah Blankenship is a 28 y.o. G2P1001 at [redacted]w[redacted]d who presents to maternity admissions reporting nausea and vomiting. She was seen in the MAU on 10/11 for the same, her OB records show a history of hyperemesis gravidarum. She reports nausea and vomiting since Friday. She has not been able to keep fluids down since yesterday. She is wearing a scopolamine  patch. Denies vaginal bleeding, leaking of fluid.   Pregnancy Course: Receives care at [facility]. Prenatal records reviewed.   Past Medical History:  Diagnosis Date   Anemia    Family history of breast cancer    Family history of colon cancer    Seasonal allergies    Tachycardia    OB History  Gravida Para Term Preterm AB Living  2 1 1  0 0 1  SAB IAB Ectopic Multiple Live Births  0 0 0 0 1    # Outcome Date GA Lbr Len/2nd Weight Sex Type Anes PTL Lv  2 Current           1 Term 05/28/18 [redacted]w[redacted]d  2013 g F Vag-Spont EPI N LIV     Complications: Low amniotic fluid   Past Surgical History:  Procedure Laterality Date   MULTIPLE TOOTH EXTRACTIONS     NASAL FRACTURE SURGERY     Family History  Problem Relation Age of Onset   Thyroid  disease Paternal Grandmother    Diabetes Maternal Grandmother    Breast cancer Maternal Grandmother        dx 50s-60s   Diabetes Maternal Grandfather    Diabetes Mother    Hypertension Mother    High Cholesterol Mother    Colon cancer Paternal Uncle        dx 38s   Breast cancer Other    Colon cancer Other    Colon cancer Maternal Great-grandfather    Bone cancer Other    Ovarian cancer Neg Hx    Social History   Tobacco Use   Smoking status: Never   Smokeless tobacco: Never  Vaping Use   Vaping status: Never Used  Substance Use Topics   Alcohol use: No   Drug use: No   Allergies  Allergen Reactions   Cefzil [Cefprozil] Rash   Medications Prior to Admission  Medication Sig Dispense Refill Last Dose/Taking   cetirizine  (ZYRTEC) 10 MG tablet Take 10 mg by mouth daily.   12/08/2023   scopolamine  (TRANSDERM-SCOP) 1 MG/3DAYS Place 1 patch (1 mg total) onto the skin every 3 (three) days. 10 patch 12 12/07/2023   [DISCONTINUED] ondansetron  (ZOFRAN ) 4 MG tablet Take 4 mg by mouth every 8 (eight) hours as needed for nausea or vomiting.   12/07/2023   [DISCONTINUED] promethazine  (PHENERGAN ) 25 MG tablet Take 25 mg by mouth every 6 (six) hours as needed for nausea or vomiting.       I have reviewed patient's Past Medical Hx, Surgical Hx, Family Hx, Social Hx, medications and allergies.   ROS  Pertinent items noted in HPI and remainder of comprehensive ROS otherwise negative.   PHYSICAL EXAM  Patient Vitals for the past 24 hrs:  BP Temp Temp src Pulse Resp SpO2 Height Weight  12/09/23 0859 117/68 98.6 F (37 C) Oral 81 18 99 % -- --  12/09/23 0855 -- -- -- -- -- -- 5' (1.524 m) 68.8 kg  FHT: 152 bpm  Constitutional: Well-developed, well-nourished female in no acute distress.  HEENT: atraumatic, normocephalic. Neck has normal ROM. EOM intact. Cardiovascular: normal rate & rhythm, warm and well-perfused Respiratory: normal effort, no problems with respiration noted GI: Abd soft, non-tender, non-distended MSK: Extremities nontender, no edema, normal ROM Skin: warm and dry. Acyanotic, no jaundice or pallor. Neurologic: Alert and oriented x 4. No abnormal coordination. Psychiatric: Normal mood. Speech not slurred, not rapid/pressured. Patient is cooperative.  Labs: Results for orders placed or performed during the hospital encounter of 12/09/23 (from the past 24 hours)  Urinalysis, Routine w reflex microscopic -Urine, Clean Catch     Status: Abnormal   Collection Time: 12/09/23  9:19 AM  Result Value Ref Range   Color, Urine AMBER (A) YELLOW   APPearance HAZY (A) CLEAR   Specific Gravity, Urine 1.027 1.005 - 1.030   pH 5.0 5.0 - 8.0   Glucose, UA NEGATIVE NEGATIVE mg/dL   Hgb urine dipstick NEGATIVE NEGATIVE    Bilirubin Urine NEGATIVE NEGATIVE   Ketones, ur 20 (A) NEGATIVE mg/dL   Protein, ur 30 (A) NEGATIVE mg/dL   Nitrite NEGATIVE NEGATIVE   Leukocytes,Ua MODERATE (A) NEGATIVE   RBC / HPF 0-5 0 - 5 RBC/hpf   WBC, UA 6-10 0 - 5 WBC/hpf   Bacteria, UA FEW (A) NONE SEEN   Squamous Epithelial / HPF 6-10 0 - 5 /HPF   Mucus PRESENT   Basic metabolic panel     Status: Abnormal   Collection Time: 12/09/23  9:19 AM  Result Value Ref Range   Sodium 136 135 - 145 mmol/L   Potassium 3.9 3.5 - 5.1 mmol/L   Chloride 101 98 - 111 mmol/L   CO2 23 22 - 32 mmol/L   Glucose, Bld 101 (H) 70 - 99 mg/dL   BUN 8 6 - 20 mg/dL   Creatinine, Ser 9.33 0.44 - 1.00 mg/dL   Calcium 9.1 8.9 - 89.6 mg/dL   GFR, Estimated >39 >39 mL/min   Anion gap 12 5 - 15    Imaging:  No results found.  MDM & MAU COURSE  MDM: Moderate  MAU Course: -Vital signs within normal limits. Afebrile and normotensive. FHT appropriate. -UA for hydration status. BMP to rule out electrolyte abnormalities. -Zofran  for nausea while awaiting results. -UA with mild dehydration. -CMP within normal limits, no electrolyte abnormalities. -No severe hydration. Recommend PO hydration at home with improved nausea control. Will send several options as well as famotidine  for heartburn.  Differential diagnosis considered for nausea/vomiting includes but is not limited to: nausea due to pregnancy, hyperemesis gravidarum, viral infection, gastroenteritis  Orders Placed This Encounter  Procedures   Urinalysis, Routine w reflex microscopic -Urine, Clean Catch   Basic metabolic panel   Discharge patient   Meds ordered this encounter  Medications   ondansetron  (ZOFRAN -ODT) disintegrating tablet 8 mg   DISCONTD: scopolamine  (TRANSDERM-SCOP) 1 MG/3DAYS 1 mg   ondansetron  (ZOFRAN ) 4 MG tablet    Sig: Take 1-2 tablets (4-8 mg total) by mouth every 8 (eight) hours as needed for nausea or vomiting.    Dispense:  30 tablet    Refill:  0    promethazine (PHENERGAN) 25 MG tablet    Sig: Take 1 tablet (25 mg total) by mouth every 6 (six) hours as needed for nausea or vomiting.    Dispense:  30 tablet    Refill:  0   metoCLOPramide  (REGLAN ) 10 MG tablet    Sig: Take 1 tablet (10 mg total) by mouth every 6 (six) hours.    Dispense:  30 tablet    Refill:  0   famotidine  (PEPCID ) 20 MG tablet    Sig: Take 1 tablet (20 mg total) by mouth daily.    Dispense:  30 tablet    Refill:  0    ASSESSMENT   1. Nausea/vomiting in pregnancy   2. [redacted] weeks gestation of pregnancy     PLAN  Discharge home in stable condition with return precautions.  Start Phenergan  every 6 hours with scopolamine  patch every 3 days for nausea prevention. Recommend small high protein meals every 2-3 hours. Start famotidine  daily to reduce stomach acid and reduce nausea. Zofran  and Reglan  for breakthrough nausea.     Allergies as of 12/09/2023       Reactions   Cefzil [cefprozil] Rash        Medication List     TAKE these medications    cetirizine 10 MG tablet Commonly known as: ZYRTEC Take 10 mg by mouth daily.   famotidine  20 MG tablet Commonly known as: PEPCID  Take 1 tablet (20 mg total) by mouth daily.   metoCLOPramide  10 MG tablet Commonly known as: REGLAN  Take 1 tablet (10 mg total) by mouth every 6 (six) hours.   ondansetron  4 MG tablet Commonly known as: ZOFRAN  Take 1-2 tablets (4-8 mg total) by mouth every 8 (eight) hours as needed for nausea or vomiting. What changed: how much to take   promethazine  25 MG tablet Commonly known as: PHENERGAN  Take 1 tablet (25 mg total) by mouth every 6 (six) hours as needed for nausea or vomiting.   scopolamine  1 MG/3DAYS Commonly known as: TRANSDERM-SCOP Place 1 patch (1 mg total) onto the skin every 3 (three) days.        Joesph DELENA Sear, PA
